# Patient Record
Sex: Female | Born: 1935
Health system: Southern US, Community
[De-identification: ages and names within clinical notes are randomized; demographics above are authoritative.]

## PROBLEM LIST (undated history)

## (undated) DIAGNOSIS — I2510 Atherosclerotic heart disease of native coronary artery without angina pectoris: Secondary | ICD-10-CM

## (undated) DIAGNOSIS — E785 Hyperlipidemia, unspecified: Secondary | ICD-10-CM

## (undated) DIAGNOSIS — I219 Acute myocardial infarction, unspecified: Secondary | ICD-10-CM

## (undated) DIAGNOSIS — I10 Essential (primary) hypertension: Secondary | ICD-10-CM

## (undated) DIAGNOSIS — J45909 Unspecified asthma, uncomplicated: Secondary | ICD-10-CM

## (undated) DIAGNOSIS — R05 Cough: Secondary | ICD-10-CM

## (undated) DIAGNOSIS — K219 Gastro-esophageal reflux disease without esophagitis: Secondary | ICD-10-CM

## (undated) DIAGNOSIS — R053 Chronic cough: Secondary | ICD-10-CM

## (undated) HISTORY — PX: OTHER SURGICAL HISTORY: SHX169

## (undated) HISTORY — DX: Hyperlipidemia, unspecified: E78.5

## (undated) HISTORY — PX: CHOLECYSTECTOMY: SHX55

## (undated) HISTORY — PX: APPENDECTOMY: SHX54

## (undated) HISTORY — PX: EYE SURGERY: SHX253

## (undated) HISTORY — DX: Atherosclerotic heart disease of native coronary artery without angina pectoris: I25.10

---

## 2000-04-11 ENCOUNTER — Encounter (HOSPITAL_BASED_OUTPATIENT_CLINIC_OR_DEPARTMENT_OTHER): Payer: Self-pay | Admitting: Internal Medicine

## 2000-04-11 ENCOUNTER — Ambulatory Visit (HOSPITAL_COMMUNITY): Admission: RE | Admit: 2000-04-11 | Discharge: 2000-04-11 | Payer: Self-pay | Admitting: Internal Medicine

## 2000-11-17 ENCOUNTER — Encounter (HOSPITAL_BASED_OUTPATIENT_CLINIC_OR_DEPARTMENT_OTHER): Payer: Self-pay | Admitting: Internal Medicine

## 2000-11-17 ENCOUNTER — Ambulatory Visit (HOSPITAL_COMMUNITY): Admission: RE | Admit: 2000-11-17 | Discharge: 2000-11-17 | Payer: Self-pay | Admitting: Internal Medicine

## 2000-11-18 ENCOUNTER — Ambulatory Visit (HOSPITAL_COMMUNITY): Admission: RE | Admit: 2000-11-18 | Discharge: 2000-11-18 | Payer: Self-pay | Admitting: Gastroenterology

## 2001-02-16 ENCOUNTER — Ambulatory Visit (HOSPITAL_COMMUNITY): Admission: RE | Admit: 2001-02-16 | Discharge: 2001-02-16 | Payer: Self-pay | Admitting: Internal Medicine

## 2001-02-16 ENCOUNTER — Encounter (HOSPITAL_BASED_OUTPATIENT_CLINIC_OR_DEPARTMENT_OTHER): Payer: Self-pay | Admitting: Internal Medicine

## 2001-03-23 ENCOUNTER — Ambulatory Visit (HOSPITAL_COMMUNITY): Admission: RE | Admit: 2001-03-23 | Discharge: 2001-03-23 | Payer: Self-pay | Admitting: Gastroenterology

## 2001-03-23 ENCOUNTER — Encounter (INDEPENDENT_AMBULATORY_CARE_PROVIDER_SITE_OTHER): Payer: Self-pay | Admitting: Specialist

## 2001-04-06 ENCOUNTER — Encounter (INDEPENDENT_AMBULATORY_CARE_PROVIDER_SITE_OTHER): Payer: Self-pay | Admitting: *Deleted

## 2001-04-06 ENCOUNTER — Ambulatory Visit (HOSPITAL_COMMUNITY): Admission: RE | Admit: 2001-04-06 | Discharge: 2001-04-06 | Payer: Self-pay | Admitting: Gastroenterology

## 2001-04-06 ENCOUNTER — Encounter (INDEPENDENT_AMBULATORY_CARE_PROVIDER_SITE_OTHER): Payer: Self-pay | Admitting: Specialist

## 2001-04-06 ENCOUNTER — Encounter: Payer: Self-pay | Admitting: Gastroenterology

## 2001-07-20 ENCOUNTER — Encounter: Payer: Self-pay | Admitting: Gastroenterology

## 2001-07-20 ENCOUNTER — Ambulatory Visit (HOSPITAL_COMMUNITY): Admission: RE | Admit: 2001-07-20 | Discharge: 2001-07-20 | Payer: Self-pay | Admitting: Gastroenterology

## 2001-08-03 ENCOUNTER — Encounter: Payer: Self-pay | Admitting: Gastroenterology

## 2001-08-03 ENCOUNTER — Ambulatory Visit (HOSPITAL_COMMUNITY): Admission: RE | Admit: 2001-08-03 | Discharge: 2001-08-03 | Payer: Self-pay | Admitting: Gastroenterology

## 2001-09-22 ENCOUNTER — Encounter: Payer: Self-pay | Admitting: Surgery

## 2001-09-25 ENCOUNTER — Encounter (INDEPENDENT_AMBULATORY_CARE_PROVIDER_SITE_OTHER): Payer: Self-pay | Admitting: Specialist

## 2001-09-25 ENCOUNTER — Inpatient Hospital Stay (HOSPITAL_COMMUNITY): Admission: RE | Admit: 2001-09-25 | Discharge: 2001-09-28 | Payer: Self-pay | Admitting: Surgery

## 2001-11-23 ENCOUNTER — Ambulatory Visit (HOSPITAL_COMMUNITY): Admission: RE | Admit: 2001-11-23 | Discharge: 2001-11-23 | Payer: Self-pay | Admitting: Internal Medicine

## 2001-11-23 ENCOUNTER — Encounter (HOSPITAL_BASED_OUTPATIENT_CLINIC_OR_DEPARTMENT_OTHER): Payer: Self-pay | Admitting: Internal Medicine

## 2002-05-25 ENCOUNTER — Ambulatory Visit (HOSPITAL_COMMUNITY): Admission: RE | Admit: 2002-05-25 | Discharge: 2002-05-25 | Payer: Self-pay | Admitting: Internal Medicine

## 2002-05-25 ENCOUNTER — Encounter (HOSPITAL_BASED_OUTPATIENT_CLINIC_OR_DEPARTMENT_OTHER): Payer: Self-pay | Admitting: Internal Medicine

## 2005-11-05 ENCOUNTER — Encounter: Admission: RE | Admit: 2005-11-05 | Discharge: 2005-11-05 | Payer: Self-pay | Admitting: Internal Medicine

## 2006-01-09 ENCOUNTER — Encounter: Admission: RE | Admit: 2006-01-09 | Discharge: 2006-01-09 | Payer: Self-pay | Admitting: Internal Medicine

## 2006-04-01 ENCOUNTER — Encounter: Admission: RE | Admit: 2006-04-01 | Discharge: 2006-04-01 | Payer: Self-pay | Admitting: Internal Medicine

## 2007-11-19 ENCOUNTER — Encounter: Payer: Self-pay | Admitting: Pulmonary Disease

## 2007-12-04 ENCOUNTER — Ambulatory Visit: Payer: Self-pay | Admitting: Pulmonary Disease

## 2007-12-04 DIAGNOSIS — R05 Cough: Secondary | ICD-10-CM

## 2007-12-04 DIAGNOSIS — I10 Essential (primary) hypertension: Secondary | ICD-10-CM

## 2007-12-25 ENCOUNTER — Ambulatory Visit: Payer: Self-pay | Admitting: Pulmonary Disease

## 2007-12-25 DIAGNOSIS — J45909 Unspecified asthma, uncomplicated: Secondary | ICD-10-CM

## 2007-12-28 ENCOUNTER — Telehealth (INDEPENDENT_AMBULATORY_CARE_PROVIDER_SITE_OTHER): Payer: Self-pay | Admitting: *Deleted

## 2008-03-24 ENCOUNTER — Ambulatory Visit: Payer: Self-pay | Admitting: Pulmonary Disease

## 2008-06-22 ENCOUNTER — Telehealth (INDEPENDENT_AMBULATORY_CARE_PROVIDER_SITE_OTHER): Payer: Self-pay | Admitting: *Deleted

## 2008-06-22 ENCOUNTER — Ambulatory Visit: Payer: Self-pay | Admitting: Pulmonary Disease

## 2008-06-22 DIAGNOSIS — J45901 Unspecified asthma with (acute) exacerbation: Secondary | ICD-10-CM

## 2008-09-20 ENCOUNTER — Ambulatory Visit: Payer: Self-pay | Admitting: Pulmonary Disease

## 2008-10-04 ENCOUNTER — Telehealth: Payer: Self-pay | Admitting: Pulmonary Disease

## 2008-10-18 ENCOUNTER — Telehealth: Payer: Self-pay | Admitting: Pulmonary Disease

## 2008-11-14 ENCOUNTER — Telehealth (INDEPENDENT_AMBULATORY_CARE_PROVIDER_SITE_OTHER): Payer: Self-pay | Admitting: *Deleted

## 2010-06-08 NOTE — Procedures (Signed)
[place]  Patient:    Jo Johnson, Jo Johnson Visit Number: [id number] MRN: [id number]          Service Type: END Location: ENDO Attending Physician:  Nelda Marseille Dictated by:   Petra Kuba, M.D. Proc. Date: 03/24/98 Admit Date:  03/23/2001   CC:         Barry Dienes. Eloise Harman, M.D.   Procedure Report  PROCEDURE:  Colonoscopy.  INDICATION:  Patient due for colonic screening with an abnormal CT scan. Consent was signed after risks, benefits, methods, options thoroughly discussed in the office.  MEDICATIONS:  Demerol 60 mg, Versed 6 mg.  DESCRIPTION OF PROCEDURE:  Rectal inspection was pertinent for external hemorrhoids, small.  Digital exam was negative.  The pediatric video adjustable colonoscope was inserted, fairly easily advanced around the colon to the cecum.  This did require rolling her on her back and some abdominal pressure.  The cecum was identified by the appendiceal orifice and the ileocecal valve; in fact, the scope was inserted a short way into the terminal ileum, which was normal.  Photo documentation was obtained, and the scope was slowly withdrawn.  The prep was adequate.  There was some liquid stool that required washing and suctioning.  The cecum, ascending, and transverse were normal as the scope was withdrawn around the descending.  In the mid-descending, a 4 mm polyp was seen and snared, electrocautery applied, and the polyp was suctioned through the scope and collected in the trap.  The scope was further withdrawn.  No additional findings were seen as we slowly withdrew back to the rectum.  Once back in the rectum the scope was then retroflexed, pertinent for some internal hemorrhoids.  The scope was straightened, air was suctioned, and the scope removed.  The patient tolerated the procedure adequately.  There was no obvious immediate complication.  ENDOSCOPIC DIAGNOSES: 1. Internal-external hemorrhoids. 2.  Mid-descending small polyp, snared and recovered. 3. Otherwise within normal limits to the terminal ileum.  PLAN:  Await pathology to determine future colonic screening.  I am awaiting the CT scan to be delivered to my office, and I will check that and review it with the radiologist to discuss needle biopsy versus exploratory laparotomy versus laparoscope.  Probably will need a surgical consultation but will wait on the above and call me sooner p.r.n. Dictated by:   Petra Kuba, M.D. Attending Physician:  Nelda Marseille DD:  03/23/01 TD:  03/23/01 Job: [id number]

## 2010-06-08 NOTE — Op Note (Signed)
   Jo Johnson, Jo Johnson                         ACCOUNT NO.:  [id number]   MEDICAL RECORD NO.:  [id number]                   PATIENT TYPE:  INP   LOCATION:  5713                                 FACILITY:  [facility]   PHYSICIAN:  Thornton Park. Daphine Deutscher, M.D.             DATE OF BIRTH:  [birth year]   DATE OF PROCEDURE:  09/25/2001  DATE OF DISCHARGE:                                 OPERATIVE REPORT   CCS [phone].   PREOPERATIVE DIAGNOSIS:  Mesenteric mass.   POSTOPERATIVE DIAGNOSIS:  Frozen section:  Lipomatous mass with fat  necrosis.   SURGEON:  Thornton Park. Daphine Deutscher, M.D.   ASSISTANT:  Adolph Pollack, M.D.   ANESTHESIA:  General endotracheal.   DESCRIPTION OF PROCEDURE:  The patient is a 77 year old lady with  aforementioned diagnosis.  The patient was taken to room 17 and given  general anesthesia.  The abdomen was prepped with Betadine and draped  sterilely.  A limited midline exploratory laparotomy incision was made, and  the abdomen was entered.  Palpation around failed to reveal anything in the  liver, stomach, and pelvis.  In the mesentery as described by CT were two  masses that were adjacent.  I began near the bowel and incised the overlying  peritoneum and using the Bovie, I dissected these masses from the mesenteric  vessels.  This appeared to be benign.  I was able to then use gentle blunt  dissection and shell these out, preserving the vasculature of the bowel.  Both were excised, and the former was sent for frozen section, which was  benign, showing fat necrosis.  After both had been removed, we used  electrocautery to control any kind of oozing or bleeding.  None was seen.  We inspected this and after the frozen section diagnosis was returned, the  sponge and needle count was correct and the abdomen was then closed with  running #1 PDS from above and below, tying in the middle.  The wound was  irrigated with saline, and the skin was closed with a staple.  The patient  seemed to tolerate the procedure well and was taken to the recovery room in  satisfactory condition.                                               Thornton Park Daphine Deutscher, M.D.    MBM/MEDQ  D:  09/25/2001  T:  09/25/2001  Job:  [id number]   cc:   Barry Dienes. Eloise Harman, M.D.

## 2010-06-08 NOTE — Discharge Summary (Signed)
   NAMESHARMILA, WROBLESKI                         ACCOUNT NO.:  [id number]   MEDICAL RECORD NO.:  [id number]                   PATIENT TYPE:  INP   LOCATION:  5713                                 FACILITY:  [facility]   PHYSICIAN:  Thornton Park. Daphine Deutscher, M.D.             DATE OF BIRTH:  [birth year]   DATE OF ADMISSION:  09/25/2001  DATE OF DISCHARGE:  09/28/2001                                 DISCHARGE SUMMARY   PROCEDURE:  Exploratory laparotomy and biopsy of mesenteric mass on  September 25, 2001.   COURSE IN THE HOSPITAL:  The patient came in with a mass in her abdomen.  She underwent exploratory laparotomy and a biopsy of this mass which proved  to be an inflamed cyst with benign lymphoid aggregates and some fat  necrosis.  No evidence of malignancy.  The patient got along very well and  was discharged on September 28, 2001.   CONDITION ON DISCHARGE:  Condition improved.   FOLLOWUP:  The patient was instructed to return to the office for staple  removal within the week.                                               Thornton Park Daphine Deutscher, M.D.    MBM/MEDQ  D:  10/19/2001  T:  10/21/2001  Job:  [id number]

## 2011-12-09 DIAGNOSIS — I10 Essential (primary) hypertension: Secondary | ICD-10-CM | POA: Diagnosis not present

## 2011-12-09 DIAGNOSIS — E785 Hyperlipidemia, unspecified: Secondary | ICD-10-CM | POA: Diagnosis not present

## 2011-12-16 DIAGNOSIS — R05 Cough: Secondary | ICD-10-CM | POA: Diagnosis not present

## 2011-12-16 DIAGNOSIS — E785 Hyperlipidemia, unspecified: Secondary | ICD-10-CM | POA: Diagnosis not present

## 2011-12-16 DIAGNOSIS — Z0000 Encounter for general adult medical examination without abnormal findings: Secondary | ICD-10-CM | POA: Diagnosis not present

## 2011-12-16 DIAGNOSIS — I10 Essential (primary) hypertension: Secondary | ICD-10-CM | POA: Diagnosis not present

## 2011-12-16 DIAGNOSIS — Z1331 Encounter for screening for depression: Secondary | ICD-10-CM | POA: Diagnosis not present

## 2011-12-16 DIAGNOSIS — R82998 Other abnormal findings in urine: Secondary | ICD-10-CM | POA: Diagnosis not present

## 2011-12-23 DIAGNOSIS — Z1212 Encounter for screening for malignant neoplasm of rectum: Secondary | ICD-10-CM | POA: Diagnosis not present

## 2012-06-21 ENCOUNTER — Emergency Department (HOSPITAL_COMMUNITY): Payer: Medicare Other

## 2012-06-21 ENCOUNTER — Inpatient Hospital Stay (HOSPITAL_COMMUNITY)
Admission: EM | Admit: 2012-06-21 | Discharge: 2012-06-23 | DRG: 247 | Disposition: A | Discharge status: Home / Self Care | Payer: Medicare Other | Attending: Cardiovascular Disease | Admitting: Cardiovascular Disease

## 2012-06-21 ENCOUNTER — Encounter (HOSPITAL_COMMUNITY): Payer: Self-pay | Admitting: Family Medicine

## 2012-06-21 DIAGNOSIS — I10 Essential (primary) hypertension: Secondary | ICD-10-CM | POA: Diagnosis present

## 2012-06-21 DIAGNOSIS — F17200 Nicotine dependence, unspecified, uncomplicated: Secondary | ICD-10-CM | POA: Diagnosis not present

## 2012-06-21 DIAGNOSIS — R079 Chest pain, unspecified: Secondary | ICD-10-CM | POA: Diagnosis not present

## 2012-06-21 DIAGNOSIS — I2510 Atherosclerotic heart disease of native coronary artery without angina pectoris: Secondary | ICD-10-CM | POA: Diagnosis not present

## 2012-06-21 DIAGNOSIS — R0789 Other chest pain: Secondary | ICD-10-CM | POA: Diagnosis not present

## 2012-06-21 DIAGNOSIS — R05 Cough: Secondary | ICD-10-CM | POA: Diagnosis present

## 2012-06-21 DIAGNOSIS — I214 Non-ST elevation (NSTEMI) myocardial infarction: Principal | ICD-10-CM | POA: Diagnosis present

## 2012-06-21 DIAGNOSIS — I200 Unstable angina: Secondary | ICD-10-CM | POA: Diagnosis present

## 2012-06-21 DIAGNOSIS — K219 Gastro-esophageal reflux disease without esophagitis: Secondary | ICD-10-CM | POA: Diagnosis present

## 2012-06-21 HISTORY — DX: Unspecified asthma, uncomplicated: J45.909

## 2012-06-21 HISTORY — DX: Gastro-esophageal reflux disease without esophagitis: K21.9

## 2012-06-21 HISTORY — DX: Essential (primary) hypertension: I10

## 2012-06-21 HISTORY — DX: Chronic cough: R05.3

## 2012-06-21 HISTORY — DX: Cough: R05

## 2012-06-21 LAB — BASIC METABOLIC PANEL
CO2: 27 mEq/L (ref 19–32)
Chloride: 103 mEq/L (ref 96–112)
Creatinine, Ser: 0.83 mg/dL (ref 0.50–1.10)
GFR calc Af Amer: 72 mL/min — ABNORMAL LOW (ref >90)
Potassium: 3.6 mEq/L (ref 3.5–5.1)
Sodium: 139 mEq/L (ref 135–145)

## 2012-06-21 LAB — LIPASE, BLOOD: Lipase: 51 U/L (ref 11–59)

## 2012-06-21 LAB — TROPONIN I
Troponin I: <0.3 ng/mL (ref <0.30)
Troponin I: 0.38 ng/mL (ref <0.30)

## 2012-06-21 LAB — CBC
MCV: 88.4 fL (ref 78.0–100.0)
Platelets: 194 10*3/uL (ref 150–400)
RBC: 4.3 MIL/uL (ref 3.87–5.11)
RDW: 12.6 % (ref 11.5–15.5)
WBC: 4.7 10*3/uL (ref 4.0–10.5)

## 2012-06-21 LAB — PRO B NATRIURETIC PEPTIDE: Pro B Natriuretic peptide (BNP): 97.2 pg/mL (ref 0–450)

## 2012-06-21 MED ORDER — HEPARIN BOLUS VIA INFUSION: 3000.0000 [IU] | Freq: Once | INTRAVENOUS | Status: DC

## 2012-06-21 MED ORDER — ASPIRIN 81 MG PO CHEW
324.0000 mg | CHEWABLE_TABLET | Freq: Once | ORAL | Status: AC
  Administered 2012-06-21: 324 mg via ORAL
  Filled 2012-06-21: qty 4

## 2012-06-21 MED ORDER — ATORVASTATIN CALCIUM 10 MG PO TABS
10.0000 mg | ORAL_TABLET | Freq: Every day | ORAL | Status: DC
  Administered 2012-06-21 – 2012-06-23 (×3): 10 mg via ORAL
  Filled 2012-06-21 (×4): qty 1

## 2012-06-21 MED ORDER — NITROGLYCERIN 0.4 MG SL SUBL: 0.4000 mg | SUBLINGUAL_TABLET | SUBLINGUAL | Status: DC | PRN

## 2012-06-21 MED ORDER — LEVALBUTEROL HCL 0.63 MG/3ML IN NEBU: 0.6300 mg | INHALATION_SOLUTION | Freq: Three times a day (TID) | RESPIRATORY_TRACT | Status: DC | PRN

## 2012-06-21 MED ORDER — PANTOPRAZOLE SODIUM 40 MG PO TBEC
40.0000 mg | DELAYED_RELEASE_TABLET | Freq: Every day | ORAL | Status: DC
  Administered 2012-06-22 – 2012-06-23 (×2): 40 mg via ORAL
  Filled 2012-06-21 (×2): qty 1

## 2012-06-21 MED ORDER — AMLODIPINE BESYLATE 5 MG PO TABS
5.0000 mg | ORAL_TABLET | Freq: Every day | ORAL | Status: DC
  Administered 2012-06-22 – 2012-06-23 (×2): 5 mg via ORAL
  Filled 2012-06-21 (×2): qty 1

## 2012-06-21 MED ORDER — HEPARIN (PORCINE) IN NACL 100-0.45 UNIT/ML-% IJ SOLN
800.0000 [IU]/h | INTRAMUSCULAR | Status: DC
  Filled 2012-06-21 (×2): qty 250

## 2012-06-21 MED ORDER — NITROGLYCERIN 0.4 MG SL SUBL
0.4000 mg | SUBLINGUAL_TABLET | SUBLINGUAL | Status: DC | PRN
  Filled 2012-06-21: qty 25

## 2012-06-21 MED ORDER — IRBESARTAN 300 MG PO TABS
300.0000 mg | ORAL_TABLET | Freq: Every day | ORAL | Status: DC
  Administered 2012-06-22 – 2012-06-23 (×2): 300 mg via ORAL
  Filled 2012-06-21 (×2): qty 1

## 2012-06-21 MED ORDER — ASPIRIN EC 81 MG PO TBEC
81.0000 mg | DELAYED_RELEASE_TABLET | Freq: Every day | ORAL | Status: DC
  Administered 2012-06-23: 81 mg via ORAL
  Filled 2012-06-21 (×2): qty 1

## 2012-06-21 MED ORDER — ONDANSETRON HCL 4 MG/2ML IJ SOLN
4.0000 mg | Freq: Four times a day (QID) | INTRAMUSCULAR | Status: DC | PRN
  Administered 2012-06-22: 4 mg via INTRAVENOUS
  Filled 2012-06-21: qty 2

## 2012-06-21 MED ORDER — ACETAMINOPHEN 325 MG PO TABS: 650.0000 mg | ORAL_TABLET | ORAL | Status: DC | PRN

## 2012-06-21 MED ORDER — METOPROLOL TARTRATE 12.5 MG HALF TABLET
12.5000 mg | ORAL_TABLET | Freq: Two times a day (BID) | ORAL | Status: DC
  Administered 2012-06-21 – 2012-06-23 (×4): 12.5 mg via ORAL
  Filled 2012-06-21 (×6): qty 1

## 2012-06-21 NOTE — Progress Notes (Signed)
ANTICOAGULATION CONSULT NOTE Pharmacy Consult for UFH Indication: USAP  No Known Allergies  Patient Measurements: Height: 5' 2.99" (160 cm) Weight: 134 lb 0.6 oz (60.8 kg) IBW/kg (Calculated) : 52.38 Heparin Dosing Weight: 60kg  Vital Signs: Temp: 98.6 F (37 C) (06/01 2141) Temp src: Oral (06/01 2141) BP: 140/67 mmHg (06/01 2141) Pulse Rate: 76 (06/01 2141)  Labs:  Recent Labs  06/21/12 1115 06/21/12 1748 06/21/12 2210  HGB 13.2  --   --   HCT 38.0  --   --   PLT 194  --   --   CREATININE 0.83  --   --   TROPONINI <0.30 0.38* 0.47*    Estimated Creatinine Clearance: 40.2 ml/min (by C-G formula based on Cr of 0.83).  Assessment: 79 y/o female with chest pain for heparin  Goal of Therapy:  Heparin level 0.3-0.7 units/ml Monitor platelets by anticoagulation protocol: Yes   Plan:  Continue Heparin at current rate Follow-up am labs.  Geannie Risen, PharmD, BCPS  06/21/2012,11:56 PM

## 2012-06-21 NOTE — Progress Notes (Signed)
Pt c/o chronic cough, requesting recommendations for pulmonary MD.

## 2012-06-21 NOTE — ED Provider Notes (Signed)
History     CSN: [id number]  Arrival date & time 06/21/12  1053   First MD Initiated Contact with Patient 06/21/12 1138      Chief Complaint  Patient presents with  . Chest Pain    (Consider location/radiation/quality/duration/timing/severity/associated sxs/prior treatment) HPI Comments: Patient presents to the ED with a CC of chest pain.  Patient states that she first noticed CP this morning while she was sitting on the couch.  She describes the pain as a dull pressure that radiates to her neck and arms.  She has no known history of ACS.  She has not tried anything to make the pain better.  She denies fever, chills, nausea, vomiting, diarrhea, numbness or tingling of the extremities.  The history is provided by the patient. No language interpreter was used.    Past Medical History  Diagnosis Date  . Hypertension   . Acid reflux     History reviewed. No pertinent past surgical history.  History reviewed. No pertinent family history.  History  Substance Use Topics  . Smoking status: Never Smoker   . Smokeless tobacco: Current User    Types: Chew  . Alcohol Use: No    OB History   Grav Para Term Preterm Abortions TAB SAB Ect Mult Living                  Review of Systems  All other systems reviewed and are negative.    Allergies  Review of patient's allergies indicates no known allergies.  Home Medications   Current Outpatient Rx  Name  Route  Sig  Dispense  Refill  . amLODipine (NORVASC) 5 MG tablet   Oral   Take 5 mg by mouth daily.         Marland Kitchen omeprazole (PRILOSEC) 20 MG capsule   Oral   Take 20 mg by mouth 2 (two) times daily.         . valsartan (DIOVAN) 320 MG tablet   Oral   Take 320 mg by mouth daily.           BP 168/84  Pulse 75  Temp(Src) 98.1 F (36.7 C)  Resp 16  SpO2 99%  Physical Exam  Nursing note and vitals reviewed. Constitutional: She is oriented to person, place, and time. She appears well-developed and  well-nourished.  HENT:  Head: Normocephalic and atraumatic.  Eyes: Conjunctivae and EOM are normal. Pupils are equal, round, and reactive to light.  Neck: Normal range of motion. Neck supple.  Cardiovascular: Normal rate and regular rhythm.  Exam reveals no gallop and no friction rub.   No murmur heard. Pulmonary/Chest: Effort normal and breath sounds normal. No respiratory distress. She has no wheezes. She has no rales. She exhibits no tenderness.  Abdominal: Soft. Bowel sounds are normal. She exhibits no distension and no mass. There is no tenderness. There is no rebound and no guarding.  Musculoskeletal: Normal range of motion. She exhibits no edema and no tenderness.  Neurological: She is alert and oriented to person, place, and time.  Skin: Skin is warm and dry.  Psychiatric: She has a normal mood and affect. Her behavior is normal. Judgment and thought content normal.    ED Course  Procedures (including critical care time)  Labs Reviewed  BASIC METABOLIC PANEL - Abnormal; Notable for the following:    Glucose, Bld 108 (*)    GFR calc non Af Amer 62 (*)    GFR calc Af Amer 72 (*)  All other components within normal limits  CBC  PRO B NATRIURETIC PEPTIDE  TROPONIN I   Dg Chest 2 View  06/21/2012   *RADIOLOGY REPORT*  Clinical Data: Chest pain.  CHEST - 2 VIEW  Comparison: 12/04/2007  Findings: Moderate right hemidiaphragm elevation is unchanged. Right glenohumeral joint osteoarthritis with intra-articular loose bodies identified. Normal heart size and mediastinal contours for age.  No pleural effusion or pneumothorax.  Biapical pleural thickening is unchanged.  Apical lordotic patient positioning. Clear lungs.  IMPRESSION: No acute cardiopulmonary disease.  Similar moderate right hemidiaphragm elevation.   Original Report Authenticated By: Jeronimo Greaves, M.D.   ED ECG REPORT  I personally interpreted this EKG   Date: 06/21/2012   Rate: 85  Rhythm: normal sinus rhythm  QRS Axis:  normal  Intervals: normal  ST/T Wave abnormalities: normal  Conduction Disutrbances:none  Narrative Interpretation:   Old EKG Reviewed: none available    1. Chest pain       MDM  Patient with chest pain that started this morning. It is associated with shortness of breath. It came while she was sitting on a couch doing nothing. I suspect that it is likely unstable angina. Troponin and chest x-ray are negative. EKG shows PVC, but is otherwise normal. Will discuss with Dr. Patria Mane, anticipate admission for ACS rule out.  Discussed the patient with [facility], who recommend calling [facility].  [facility] will admit the patient.  Heparin per pharmacy.        Roxy Horseman, PA-C 06/21/12 1439

## 2012-06-21 NOTE — H&P (Signed)
Jo Johnson is an 79 y.o. female.   Chief Complaint: Chest pain HPI:   The patient is an 79 year old Caucasian female with a history of hypertension and acid reflux. She states she developed chest pain this morning after breakfast when she was laying on the couch, which she describes as a "hurting", along with pain in her back both arms, neck and dizziness. She also reports some abdominal pain and lower extremity edema. She denies nausea, vomiting, fever, shortness of breath beyond her usual state, orthopnea, PND, dysuria, hematochezia, melena.  Vacations: Prior to Admission medications   Medication Sig Start Date End Date Taking? Authorizing Provider  amLODipine (NORVASC) 5 MG tablet Take 5 mg by mouth daily.   Yes Historical Provider, MD  omeprazole (PRILOSEC) 20 MG capsule Take 20 mg by mouth 2 (two) times daily.   Yes Historical Provider, MD  valsartan (DIOVAN) 320 MG tablet Take 320 mg by mouth daily.   Yes Historical Provider, MD     Past Medical History  Diagnosis Date  . Hypertension   . Acid reflux     History reviewed. No pertinent past surgical history.  History reviewed. No pertinent family history. Social History:  reports that she has never smoked. Her smokeless tobacco use includes Chew. She reports that she does not drink alcohol. Her drug history is not on file.  Allergies: No Known Allergies   (Not in a hospital admission)  Results for orders placed during the hospital encounter of 06/21/12 (from the past 48 hour(s))  CBC     Status: None   Collection Time    06/21/12 11:15 AM      Result Value Range   WBC 4.7  4.0 - 10.5 K/uL   RBC 4.30  3.87 - 5.11 MIL/uL   Hemoglobin 13.2  12.0 - 15.0 g/dL   HCT 16.1  09.6 - 04.5 %   MCV 88.4  78.0 - 100.0 fL   MCH 30.7  26.0 - 34.0 pg   MCHC 34.7  30.0 - 36.0 g/dL   RDW 40.9  81.1 - 91.4 %   Platelets 194  150 - 400 K/uL  BASIC METABOLIC PANEL     Status: Abnormal   Collection Time    06/21/12 11:15 AM   Result Value Range   Sodium 139  135 - 145 mEq/L   Potassium 3.6  3.5 - 5.1 mEq/L   Chloride 103  96 - 112 mEq/L   CO2 27  19 - 32 mEq/L   Glucose, Bld 108 (*) 70 - 99 mg/dL   BUN 15  6 - 23 mg/dL   Creatinine, Ser 7.82  0.50 - 1.10 mg/dL   Calcium 9.2  8.4 - 95.6 mg/dL   GFR calc non Af Amer 62 (*) >90 mL/min   GFR calc Af Amer 72 (*) >90 mL/min   Comment:            The eGFR has been calculated     using the CKD EPI equation.     This calculation has not been     validated in all clinical     situations.     eGFR's persistently     <90 mL/min signify     possible Chronic Kidney Disease.  PRO B NATRIURETIC PEPTIDE     Status: None   Collection Time    06/21/12 11:15 AM      Result Value Range   Pro B Natriuretic peptide (BNP) 97.2  0 - 450  pg/mL  TROPONIN I     Status: None   Collection Time    06/21/12 11:15 AM      Result Value Range   Troponin I <0.30  <0.30 ng/mL   Comment:            Due to the release kinetics of cTnI,     a negative result within the first hours     of the onset of symptoms does not rule out     myocardial infarction with certainty.     If myocardial infarction is still suspected,     repeat the test at appropriate intervals.   Dg Chest 2 View  06/21/2012   *RADIOLOGY REPORT*  Clinical Data: Chest pain.  CHEST - 2 VIEW  Comparison: 12/04/2007  Findings: Moderate right hemidiaphragm elevation is unchanged. Right glenohumeral joint osteoarthritis with intra-articular loose bodies identified. Normal heart size and mediastinal contours for age.  No pleural effusion or pneumothorax.  Biapical pleural thickening is unchanged.  Apical lordotic patient positioning. Clear lungs.  IMPRESSION: No acute cardiopulmonary disease.  Similar moderate right hemidiaphragm elevation.   Original Report Authenticated By: Jeronimo Greaves, M.D.    Review of Systems  Constitutional: Negative for fever and diaphoresis.  HENT: Positive for neck pain (Neck pain associated with  chest pain). Negative for sore throat.   Respiratory: Positive for cough and shortness of breath (no more than usual).   Cardiovascular: Positive for chest pain. Negative for orthopnea, leg swelling and PND.  Gastrointestinal: Positive for abdominal pain. Negative for nausea, vomiting, diarrhea, constipation, blood in stool and melena.  Genitourinary: Negative for hematuria.  Musculoskeletal: Positive for myalgias (Arm pain associated with chest pain).  Neurological: Positive for dizziness.    Blood pressure 154/109, pulse 71, temperature 98.1 F (36.7 C), resp. rate 16, height 5' 2.99" (1.6 m), weight 136 lb 11 oz (62 kg), SpO2 95.00%. Physical Exam  Constitutional: She is oriented to person, place, and time. She appears well-developed and well-nourished.  HENT:  Head: Normocephalic and atraumatic.  Mouth/Throat: Oropharynx is clear and moist. No oropharyngeal exudate.  Eyes: EOM are normal. Pupils are equal, round, and reactive to light. No scleral icterus.  Neck: Normal range of motion. Neck supple. No JVD present.  Cardiovascular: Normal rate, regular rhythm, S1 normal and S2 normal.   No murmur heard. Pulses:      Radial pulses are 2+ on the right side, and 2+ on the left side.       Dorsalis pedis pulses are 2+ on the right side, and 2+ on the left side.  No carotid bruit  Respiratory: Effort normal and breath sounds normal. She has no rales. She exhibits no tenderness.  GI: Soft. Bowel sounds are normal. There is tenderness (epigastric).  Musculoskeletal: She exhibits no edema.  Lymphadenopathy:    She has no cervical adenopathy.  Neurological: She is alert and oriented to person, place, and time. She exhibits normal muscle tone.  Skin: Skin is warm and dry.  Psychiatric: She has a normal mood and affect.     Assessment/Plan Principal Problem:   Unstable angina Active Problems:   HYPERTENSION   Cough  Plan: Patient will be admitted be admitted to the telemetry floor  for observation as she is chest pain-free at this time. EKG shows no acute changes.  She does complain of some epigastric pain as well. Add protonix.  We'll check lipids, TSH, A1c, cycle cardiac enzymes, lipase.  Continue aspirin and start Lopressor 12.5 mg  bid and statin along with home meds which include amlodipine and Diovan. Schedule Lexiscan myoview for the AM.  Xopenex when necessary.  Wilburt Finlay 06/21/2012, 3:45 PM    Agree with note written by Jones Skene PAC  79 y/o female w/o prior cardiac history with only CRF signif for Rx HTN developed worrisome CP this AM. ALso has h/o GERD. Currently pain free. Exam benign. EKG w/o acute changes. Initial trop neg, second minimally +. Plans are for lexiscan myoview unless trop continues to rise then plan cath. Start iv hep.     Runell Gess 06/21/2012 8:11 PM

## 2012-06-21 NOTE — ED Notes (Signed)
Patient transported to X-ray 

## 2012-06-21 NOTE — ED Provider Notes (Signed)
Medical screening examination/treatment/procedure(s) were conducted as a shared visit with non-physician practitioner(s) and myself.  I personally evaluated the patient during the encounter  3:54 PM Patient is chest pain-free at this time.  Aspirin has been given.  Heparin has been ordered.  Cardiology to admit the patient.  EKG and troponin are both without abnormalities.  [facility] to evaluate  Lyanne Co, MD 06/21/12 1555

## 2012-06-21 NOTE — Progress Notes (Signed)
ANTICOAGULATION CONSULT NOTE - Initial Consult  Pharmacy Consult for UFH Indication: USAP  No Known Allergies  Patient Measurements: Height: 5' 2.99" (160 cm) Weight: 136 lb 11 oz (62 kg) IBW/kg (Calculated) : 52.38 Heparin Dosing Weight: 60kg  Vital Signs: Temp: 98.1 F (36.7 C) (06/01 1100) BP: 154/109 mmHg (06/01 1336) Pulse Rate: 71 (06/01 1336)  Labs:  Recent Labs  06/21/12 1115  HGB 13.2  HCT 38.0  PLT 194  CREATININE 0.83  TROPONINI <0.30    Estimated Creatinine Clearance: 40.2 ml/min (by C-G formula based on Cr of 0.83).   Medical History: Past Medical History  Diagnosis Date  . Hypertension   . Acid reflux     Medications:   (Not in a hospital admission)  Assessment: 79 y/o female patient admitted with chest pain requiring anticoagulation for r/o MI. EKG wnl, first troponin negative.  Goal of Therapy:  Heparin level 0.3-0.7 units/ml Monitor platelets by anticoagulation protocol: Yes   Plan:  Heparin 3000 unit IV bolus followed by infusion at 800 units/hr. Check 8 hour heparin level with daily cbc and heparin level.  Verlene Mayer, PharmD, BCPS Pager [phone] 06/21/2012,2:26 PM

## 2012-06-21 NOTE — Progress Notes (Signed)
CRITICAL VALUE ALERT  Critical value received: troponin 0.38  Date of notification:  06/21/12  Time of notification:  1845  Critical value read back:yes  Nurse who received alert: Newell Coral RN  MD notified (1st page): Turner  Time of first page:  1845  MD notified (2nd page):  Time of second page:  Responding MD:  Mayford Knife  Time MD responded:  1848  Pt denies CP, heparin gtt started, pt NPO past midnight for possible cath.

## 2012-06-21 NOTE — ED Notes (Signed)
Per pt sts chest pain that started this am associated with SOB. sts some dizziness without N,V.

## 2012-06-21 NOTE — ED Notes (Signed)
Ordered pt a diet tray 

## 2012-06-22 ENCOUNTER — Encounter (HOSPITAL_COMMUNITY)
Admission: EM | Disposition: A | Discharge status: Home / Self Care | Payer: Self-pay | Source: Home / Self Care | Attending: Cardiovascular Disease

## 2012-06-22 DIAGNOSIS — I214 Non-ST elevation (NSTEMI) myocardial infarction: Secondary | ICD-10-CM | POA: Diagnosis not present

## 2012-06-22 DIAGNOSIS — K219 Gastro-esophageal reflux disease without esophagitis: Secondary | ICD-10-CM | POA: Diagnosis not present

## 2012-06-22 DIAGNOSIS — I2510 Atherosclerotic heart disease of native coronary artery without angina pectoris: Secondary | ICD-10-CM | POA: Diagnosis not present

## 2012-06-22 DIAGNOSIS — I10 Essential (primary) hypertension: Secondary | ICD-10-CM | POA: Diagnosis not present

## 2012-06-22 DIAGNOSIS — F17200 Nicotine dependence, unspecified, uncomplicated: Secondary | ICD-10-CM | POA: Diagnosis not present

## 2012-06-22 DIAGNOSIS — R079 Chest pain, unspecified: Secondary | ICD-10-CM | POA: Diagnosis not present

## 2012-06-22 HISTORY — PX: PERCUTANEOUS CORONARY STENT INTERVENTION (PCI-S): SHX5485

## 2012-06-22 HISTORY — PX: LEFT HEART CATHETERIZATION WITH CORONARY ANGIOGRAM: SHX5451

## 2012-06-22 LAB — LIPID PANEL
Cholesterol: 153 mg/dL (ref 0–200)
HDL: 45 mg/dL (ref >39)
Total CHOL/HDL Ratio: 3.4 RATIO
VLDL: 20 mg/dL (ref 0–40)

## 2012-06-22 LAB — BASIC METABOLIC PANEL
BUN: 17 mg/dL (ref 6–23)
CO2: 28 mEq/L (ref 19–32)
Chloride: 105 mEq/L (ref 96–112)
Creatinine, Ser: 0.9 mg/dL (ref 0.50–1.10)
GFR calc Af Amer: 65 mL/min — ABNORMAL LOW (ref >90)
Potassium: 3.7 mEq/L (ref 3.5–5.1)

## 2012-06-22 LAB — HEPARIN LEVEL (UNFRACTIONATED)
Heparin Unfractionated: 0.5 IU/mL (ref 0.30–0.70)
Heparin Unfractionated: 0.38 IU/mL (ref 0.30–0.70)

## 2012-06-22 LAB — CBC
HCT: 34.4 % — ABNORMAL LOW (ref 36.0–46.0)
MCV: 88.9 fL (ref 78.0–100.0)
RBC: 3.87 MIL/uL (ref 3.87–5.11)
RDW: 12.7 % (ref 11.5–15.5)
WBC: 4.9 10*3/uL (ref 4.0–10.5)

## 2012-06-22 LAB — POCT ACTIVATED CLOTTING TIME: Activated Clotting Time: 633 seconds

## 2012-06-22 SURGERY — LEFT HEART CATHETERIZATION WITH CORONARY ANGIOGRAM
Anesthesia: LOCAL

## 2012-06-22 MED ORDER — ASPIRIN 81 MG PO CHEW: 324.0000 mg | CHEWABLE_TABLET | ORAL | Status: DC

## 2012-06-22 MED ORDER — FAMOTIDINE IN NACL 20-0.9 MG/50ML-% IV SOLN
INTRAVENOUS | Status: AC
  Filled 2012-06-22: qty 50

## 2012-06-22 MED ORDER — SODIUM CHLORIDE 0.9 % IJ SOLN: 3.0000 mL | INTRAMUSCULAR | Status: DC | PRN

## 2012-06-22 MED ORDER — SODIUM CHLORIDE 0.9 % IJ SOLN
3.0000 mL | Freq: Two times a day (BID) | INTRAMUSCULAR | Status: DC
  Administered 2012-06-22 – 2012-06-23 (×2): 3 mL via INTRAVENOUS

## 2012-06-22 MED ORDER — HEPARIN (PORCINE) IN NACL 2-0.9 UNIT/ML-% IJ SOLN
INTRAMUSCULAR | Status: AC
  Filled 2012-06-22: qty 1000

## 2012-06-22 MED ORDER — LIDOCAINE HCL (PF) 1 % IJ SOLN
INTRAMUSCULAR | Status: AC
  Filled 2012-06-22: qty 30

## 2012-06-22 MED ORDER — SODIUM CHLORIDE 0.9 % IV SOLN: 250.0000 mL | INTRAVENOUS | Status: DC | PRN

## 2012-06-22 MED ORDER — SODIUM CHLORIDE 0.9 % IJ SOLN: 3.0000 mL | Freq: Two times a day (BID) | INTRAMUSCULAR | Status: DC

## 2012-06-22 MED ORDER — MORPHINE SULFATE 2 MG/ML IJ SOLN: 1.0000 mg | INTRAMUSCULAR | Status: DC | PRN

## 2012-06-22 MED ORDER — SODIUM CHLORIDE 0.9 % IV SOLN: 1.0000 mL/kg/h | INTRAVENOUS | Status: DC

## 2012-06-22 MED ORDER — HEPARIN SODIUM (PORCINE) 1000 UNIT/ML IJ SOLN
INTRAMUSCULAR | Status: AC
  Filled 2012-06-22: qty 1

## 2012-06-22 MED ORDER — BIVALIRUDIN 250 MG IV SOLR
INTRAVENOUS | Status: AC
  Filled 2012-06-22: qty 250

## 2012-06-22 MED ORDER — MIDAZOLAM HCL 2 MG/2ML IJ SOLN
INTRAMUSCULAR | Status: AC
  Filled 2012-06-22: qty 2

## 2012-06-22 MED ORDER — FENTANYL CITRATE 0.05 MG/ML IJ SOLN
INTRAMUSCULAR | Status: AC
  Filled 2012-06-22: qty 2

## 2012-06-22 MED ORDER — TICAGRELOR 90 MG PO TABS
90.0000 mg | ORAL_TABLET | Freq: Two times a day (BID) | ORAL | Status: DC
  Administered 2012-06-23: 90 mg via ORAL
  Filled 2012-06-22 (×3): qty 1

## 2012-06-22 MED ORDER — ASPIRIN 81 MG PO CHEW
324.0000 mg | CHEWABLE_TABLET | ORAL | Status: AC
  Administered 2012-06-22: 324 mg via ORAL
  Filled 2012-06-22: qty 4

## 2012-06-22 MED ORDER — VERAPAMIL HCL 2.5 MG/ML IV SOLN
INTRAVENOUS | Status: AC
  Filled 2012-06-22: qty 2

## 2012-06-22 MED ORDER — TICAGRELOR 90 MG PO TABS
ORAL_TABLET | ORAL | Status: AC
  Administered 2012-06-22: 90 mg via ORAL
  Filled 2012-06-22: qty 2

## 2012-06-22 MED ORDER — SODIUM CHLORIDE 0.9 % IV SOLN: 1.0000 mL/kg/h | INTRAVENOUS | Status: AC

## 2012-06-22 NOTE — Progress Notes (Signed)
trasnferred infrom the cath lab awake and alert. TRBand to right wrist intact with 12 ml air, elevated with pillow pulses palpable.. Pulse ox to right thumb. angiomax in progress and completed at 1730h. Continue to monitor.

## 2012-06-22 NOTE — Progress Notes (Signed)
ANTICOAGULATION CONSULT NOTE Pharmacy Consult for UFH Indication: USAP  No Known Allergies  Patient Measurements: Height: 5' 2.99" (160 cm) Weight: 132 lb 11.5 oz (60.2 kg) IBW/kg (Calculated) : 52.38 Heparin Dosing Weight: 60kg  Vital Signs: Temp: 98.5 F (36.9 C) (06/02 0544) Temp src: Oral (06/02 0544) BP: 153/52 mmHg (06/02 1000) Pulse Rate: 72 (06/02 1001)  Labs:  Recent Labs  06/21/12 1115 06/21/12 1748 06/21/12 2210 06/21/12 2313 06/22/12 0520  HGB 13.2  --   --   --  11.5*  HCT 38.0  --   --   --  34.4*  PLT 194  --   --   --  166  HEPARINUNFRC  --   --   --  0.50 0.38  CREATININE 0.83  --   --   --  0.90  TROPONINI <0.30 0.38* 0.47*  --  <0.30   Estimated Creatinine Clearance: 37.1 ml/min (by C-G formula based on Cr of 0.9).  Assessment: 79 y/o female admitted with c/o chest pain.  We have been asked to dose her IV heparin.  Her CBC is stable and she is without noted bleeding complications.  Her heparin level is therapeutic at 0.38.  Goal of Therapy:  Heparin level 0.3-0.7 units/ml Monitor platelets by anticoagulation protocol: Yes   Plan:  Continue Heparin at current rate Follow-up am labs.  Nadara Mustard, PharmD., MS Clinical Pharmacist Pager:  [phone] Thank you for allowing pharmacy to be part of this patients care team. 06/22/2012,10:09 AM

## 2012-06-22 NOTE — Progress Notes (Signed)
[facility]  Subjective: Denies further CP. No SOB.  Objective: Vital signs in last 24 hours: Temp:  [98.1 F (36.7 C)-98.8 F (37.1 C)] 98.5 F (36.9 C) (06/02 0544) Pulse Rate:  [64-82] 64 (06/02 0544) Resp:  [16-26] 18 (06/02 0544) BP: (135-175)/(62-109) 135/62 mmHg (06/02 0544) SpO2:  [95 %-100 %] 97 % (06/02 0544) Weight:  [132 lb 11.5 oz (60.2 kg)-136 lb 11 oz (62 kg)] 132 lb 11.5 oz (60.2 kg) (06/02 0544) Last BM Date: 06/21/12  Intake/Output from previous day:   Intake/Output this shift:    Medications Current Facility-Administered Medications  Medication Dose Route Frequency Provider Last Rate Last Dose  . 0.9 %  sodium chloride infusion  250 mL Intravenous PRN Wilburt Finlay, PA-C      . 0.9 %  sodium chloride infusion  1 mL/kg/hr Intravenous Continuous Wilburt Finlay, PA-C      . acetaminophen (TYLENOL) tablet 650 mg  650 mg Oral Q4H PRN Wilburt Finlay, PA-C      . amLODipine (NORVASC) tablet 5 mg  5 mg Oral Daily Wilburt Finlay, PA-C      . [START ON 06/23/2012] aspirin chewable tablet 324 mg  324 mg Oral Pre-Cath Wilburt Finlay, PA-C      . aspirin EC tablet 81 mg  81 mg Oral Daily Wilburt Finlay, PA-C      . atorvastatin (LIPITOR) tablet 10 mg  10 mg Oral q1800 Wilburt Finlay, PA-C   10 mg at 06/21/12 1844  . heparin ADULT infusion 100 units/mL (25000 units/250 mL)  800 Units/hr Intravenous Continuous Lyanne Co, MD      . heparin bolus via infusion 3,000 Units  3,000 Units Intravenous Once Lyanne Co, MD      . irbesartan Evlyn Kanner) tablet 300 mg  300 mg Oral Daily Wilburt Finlay, PA-C      . levalbuterol [facility]) nebulizer solution 0.63 mg  0.63 mg Nebulization Q8H PRN Wilburt Finlay, PA-C      . metoprolol tartrate (LOPRESSOR) tablet 12.5 mg  12.5 mg Oral BID Wilburt Finlay, PA-C   12.5 mg at 06/21/12 2234  . nitroGLYCERIN (NITROSTAT) SL tablet 0.4 mg  0.4 mg Sublingual Q5 Min x 3 PRN Roxy Horseman, PA-C      . nitroGLYCERIN (NITROSTAT) SL tablet 0.4  mg  0.4 mg Sublingual Q5 Min x 3 PRN Wilburt Finlay, PA-C      . ondansetron (ZOFRAN) injection 4 mg  4 mg Intravenous Q6H PRN Wilburt Finlay, PA-C      . pantoprazole (PROTONIX) EC tablet 40 mg  40 mg Oral Daily Wilburt Finlay, PA-C      . sodium chloride 0.9 % injection 3 mL  3 mL Intravenous Q12H Bryan Hager, PA-C      . sodium chloride 0.9 % injection 3 mL  3 mL Intravenous PRN Wilburt Finlay, PA-C        PE: General appearance: alert, cooperative and no distress Lungs: clear to auscultation bilaterally Heart: regular rate and rhythm Extremities: no LEE Pulses: 2+ and symmetric Skin: warm and dry Neurologic: Grossly normal  Lab Results:   Recent Labs  06/21/12 1115 06/22/12 0520  WBC 4.7 4.9  HGB 13.2 11.5*  HCT 38.0 34.4*  PLT 194 166   BMET  Recent Labs  06/21/12 1115 06/22/12 0520  NA 139 141  K 3.6 3.7  CL 103 105  CO2 27 28  GLUCOSE 108* 102*  BUN 15 17  CREATININE 0.83 0.90  CALCIUM  9.2 8.7   PT/INR No results found for this basename: LABPROT, INR,  in the last 72 hours Cholesterol  Recent Labs  06/22/12 0520  CHOL 153   Cardiac Enzymes Cardiac Panel (last 3 results)  Recent Labs  06/21/12 1748 06/21/12 2210 06/22/12 0520  TROPONINI 0.38* 0.47* <0.30    Assessment/Plan  Principal Problem:   Unstable angina Active Problems:   HYPERTENSION   Cough  Plan: Pt has ruled in for NSTEMI. Troponin peaked at 0.47. She is on IV heparin. No further CP. Plan for diagnostic LHC today with possible PCI. She is NPO. HR and BP stable. Renal function is WNL. INR pending. Will need to monitor H/H, which has dropped from 13.2/38 to 11.5/34.4 over the course of several hours. Platelets are also down from 194 to 166. Will follow closely.    LOS: 1 day   Jo Johnson 06/22/2012 7:53 AM  I have seen and evaluated the patient this Am along with Boyce Medici, or Wilburt Finlay, [place]. I agree with her findings, examination as well as impression  recommendations.  No further CP.  Mild Troponin elevation overnight.  Exam essentially benign.  Son note that she has paroxysmal of SOB & coughing -- had a great deal of attention o/n by RN.  Sounds like potential GERD related aspiration.   Will need to monitor.  Per Dr. Hazle Coca recommendations, with + Troponin, would op for LHC+/- PCI as opposed to NST.   The procedure with Risks/Benefits/Alternatives and Indications was reviewed with the patient and son.  All questions were answered.    Risks / Complications include, but not limited to: Death, MI, CVA/TIA, VF/VT (with defibrillation), Bradycardia (need for temporary pacer placement), contrast induced nephropathy, bleeding / bruising / hematoma / pseudoaneurysm, vascular or coronary injury (with possible emergent CT or Vascular Surgery), adverse medication reactions, infection.    The patient and son voice understanding and agree to proceed.    MD Time with pt: 10 min  HARDING,DAVID W, M.D., M.S. [facility] [address] [place], [place]  [address]  [phone] Pager # [phone] 06/22/2012 8:45 AM

## 2012-06-22 NOTE — CV Procedure (Signed)
CARDIAC CATHETERIZATION AND PERCUTANEOUS CORONARY INTERVENTION REPORT  NAME:  Jo Johnson   MRN: [id number] DOB:  12-20-1924   ADMIT DATE: 06/21/2012 Procedure Date: 06/22/2012  INTERVENTIONAL CARDIOLOGIST: Marykay Lex, M.D., MS PRIMARY CARE PROVIDER: Sheryn Bison, MD PRIMARY CARDIOLOGIST:  Runell Gess., MD  PATIENT:  Jo Johnson is a 79 y.o. female a history of hypertension and acid reflux. She states she developed chest pain this morning after breakfast when she was laying on the couch, which she describes as a "hurting", along with pain in her back both arms, neck and dizziness.  She ruled in for ACS/NSTEMI with mildly elevated Troponin levels, and the decision was made to convert from Non-invasive Cardiolite ST to Cardiac Catheterization.   PRE-OPERATIVE DIAGNOSIS:    ACS/NSTEMI  PROCEDURES PERFORMED:    LEFT HEART CATHETERIZATION WITH NATIVE CORONARY ANGIOGRAPHY & LEFT VENTRICULOGRAPHY  PERCUTANEOUS CORONARY INTERVENTION - Proximal Circumflex with a Promus Premier DES 3.0 mm x 16 mm - dilated to 3.2 mm  PROCEDURE:Consent:  Risks of procedure as well as the alternatives and risks of each were explained to the (patient/caregiver).  Consent for procedure obtained. Consent for signed by MD and patient with RN witness -- placed on chart.   PROCEDURE: The patient was brought to the 2nd [facility] in the fasting state and prepped and draped in the usual sterile fashion for Right groin or radial access. A modified Allen's test with plethysmography was performed, revealing excellent Ulnar artery collateral flow.  Sterile technique was used including antiseptics, cap, gloves, gown, hand hygiene, mask and sheet.  Skin prep: Chlorhexidine.  Time Out: Verified patient identification, verified procedure, site/side was marked, verified correct patient position, special equipment/implants available, medications/allergies/relevent history reviewed,  required imaging and test results available.  Performed  Access: Right Radial Artery; 5 Fr Sheath -- Seldinger technique using an Angiocath Micropuncture Kit  IA Radial Cocktail 10 ml, IV Heparin 3000 Units administered. Diagnostic:  TIG 4.0, Angled Pigtail advanced over Versicore wire and exchanged over long-exchange safety-J-wire  Left & Right Coronary Artery Angiography: TIG 4.0  LV Hemodynamics (LV Gram): Angled Pigtail  TR Band:  1605 Hours, 14 mL air; non-occlusive hemostasis confirmed.  MEDICATIONS:  Anesthesia:  Local Lidocaine 2 ml  Sedation:  1 mg IV Versed, 50 mcg IV fentanyl ;   Omnipaque Contrast: 160 ml  Anticoagulation:    IV Heparin 3000 Units ;   Angiomax Bolus & drip  Anti-Platelet Agent:  Brilinta 180 mg  Hemodynamics:  Central Aortic / Mean Pressures: 130/49 mmHg; 79 mmHg  Left Ventricular Pressures / EDP: 128/2 mmHg; 9 mmHg  Left Ventriculography:  EF: 60-65% %  Wall Motion: normal  Coronary Anatomy:  Left Main: Large-caliber vessel, bifurcates into the LAD, and Circumflex; angiographically normal. LAD: Moderate large-caliber vessel with a proximal 20-30% stenosis followed by normal region and yet again another 30-40% stenosis. There is a small first septal perforator and a small first diagonal branch. The vessel then gives rise to a moderate-sized second diagonal branch just after a hinge point in the vessel. The LAD then tapers down to the apex and into a small vessel  With mild diffuse luminal irregularities distally.  D2: Moderate caliber vessel, there is a major diagonal branch. It is obese the same size the LAD at this point.mild diffuse luminal irregularities distally.  Left Circumflex: Very large caliber, nondominant vessel with a proximal hazy 85% stenosis that is the likely culprit lesion for non-STEMI. There are several  small branches off this vessel which essentially courses as a tortuous, large lateral OM. There is a short 80-90% focal  stenosis very distal in this vessel that is not approachable for PCI standpoint.   RCA: Large caliber, dominant vessel with diffuse proximal and mid stenoses. There is a long tubular, calcified 50-60% stenosis followed by a focal ectatic segment and then yet again another if tubular 60% stenosis followed by a normal segment prior to a focal 60% lesion just at a relatively proximal bifurcation into the Right PDA and the  Right Posterior AV Groove Branch (RPAV). Beyond this point the RCA system is relatively free of disease  RPDA: This is actually a dual system. The branch from the main RCA at the RPAV is actually relatively small vessel. There is a RV marginal branch that then courses down as a tandem PDA vessel. This vessel reaches almost all her to the apex and is a smaller moderate caliber vessel.  RPL Sysytem:The RPAV moderate caliber vessel gives rise to several right posterolateral branches. Mild diffuse luminal irregularities but nothing significant.  After reviewing the initial angiographic data with Dr. Allyson Sabal, the decision was made that the culprit lesion was indeed the proximal Circumflex lesion. With the plan to treat the diffuse RCA lesions medically, the decision was made to proceed with intervention on the proximal Circumflex lesion.  Percutaneous Coronary Intervention:  Sheath exchanged for 6 Fr Lesion: Proximal circumflex hazy, focal 85% likely thrombotic stenosis Guide: 6 Fr   XBLAD 3.5  Guidewire: Prowater Predilation Balloon: Emerge MR 2.0 mm x 12 mm;   8 Atm x 30 Sec Stent: Promus Premier DES 3.0 mm x 16 mm; the additional length was used simply to avoid the edges the stent being in the possible bed of the vessel.  Deployment: 12 Atm x 30 Sec,  Post-dilation with Stent Balloon: 16 mm x 60 mm;   Final Diameter: 3.2 mm  Post deployment angiography in multiple views, with and without guidewire in place revealed excellent stent deployment and lesion coverage.  There was no  evidence of dissection or perforation.  PATIENT DISPOSITION:    The patient was transferred to the PACU holding area in a hemodynamicaly stable, chest pain free condition.  The patient tolerated the procedure well, and there were no complications.  EBL:   < 10 ml  The patient was stable before, during, and after the procedure.  POST-OPERATIVE DIAGNOSIS:    Severe 2 vessel disease, with the likely culprit lesion being a focal hazy ~85-90% proximal Circumflex stenosis treated with PCI with a distal OM ~90% stenosis, along with extensive moderate to severe Proximal to Mid RCA disease.  Successful PCI of the Proximal Circumflex with a Promus Premier DES 3.0 mm x 18 mm - post-dilated to 3.2 mm.  Moderate LAD disease  Normal LV Function with normal LVEDP.  PLAN OF CARE:  Monitor overnight in SDU/TCU post Radial Cath PCI.  Anticipate d/c in AM.  DAPT for minimum of 1 yr.  Continue to titrate cardiac medications.  Dr. Allyson Sabal & Dr. Jarold Motto have been updated. Family updated.   Marykay Lex, M.D., M.S. [facility] [address] [place], [place]  [address]  [phone]  06/22/2012 4:15 PM

## 2012-06-23 DIAGNOSIS — R079 Chest pain, unspecified: Secondary | ICD-10-CM | POA: Diagnosis not present

## 2012-06-23 DIAGNOSIS — I214 Non-ST elevation (NSTEMI) myocardial infarction: Secondary | ICD-10-CM

## 2012-06-23 DIAGNOSIS — K219 Gastro-esophageal reflux disease without esophagitis: Secondary | ICD-10-CM | POA: Diagnosis not present

## 2012-06-23 DIAGNOSIS — I200 Unstable angina: Secondary | ICD-10-CM | POA: Diagnosis not present

## 2012-06-23 DIAGNOSIS — I10 Essential (primary) hypertension: Secondary | ICD-10-CM | POA: Diagnosis not present

## 2012-06-23 DIAGNOSIS — F17200 Nicotine dependence, unspecified, uncomplicated: Secondary | ICD-10-CM | POA: Diagnosis not present

## 2012-06-23 LAB — CBC
HCT: 36.5 % (ref 36.0–46.0)
MCV: 87.1 fL (ref 78.0–100.0)
RBC: 4.19 MIL/uL (ref 3.87–5.11)
WBC: 5.3 10*3/uL (ref 4.0–10.5)

## 2012-06-23 LAB — BASIC METABOLIC PANEL
BUN: 11 mg/dL (ref 6–23)
CO2: 28 mEq/L (ref 19–32)
Chloride: 103 mEq/L (ref 96–112)
Creatinine, Ser: 0.84 mg/dL (ref 0.50–1.10)

## 2012-06-23 MED ORDER — PANTOPRAZOLE SODIUM 40 MG PO TBEC: 40.0000 mg | DELAYED_RELEASE_TABLET | Freq: Every day | ORAL | Status: DC

## 2012-06-23 MED ORDER — TICAGRELOR 90 MG PO TABS: 90.0000 mg | ORAL_TABLET | Freq: Two times a day (BID) | ORAL | Status: DC

## 2012-06-23 MED ORDER — NITROGLYCERIN 0.4 MG SL SUBL: 0.4000 mg | SUBLINGUAL_TABLET | SUBLINGUAL | Status: DC | PRN

## 2012-06-23 MED ORDER — ATORVASTATIN CALCIUM 10 MG PO TABS: 10.0000 mg | ORAL_TABLET | Freq: Every day | ORAL | Status: DC

## 2012-06-23 MED ORDER — ASPIRIN 81 MG PO TBEC: 81.0000 mg | DELAYED_RELEASE_TABLET | Freq: Every day | ORAL | Status: DC

## 2012-06-23 MED ORDER — METOPROLOL TARTRATE 12.5 MG HALF TABLET: 12.5000 mg | ORAL_TABLET | Freq: Two times a day (BID) | ORAL | Status: DC

## 2012-06-23 MED FILL — Sodium Chloride IV Soln 0.9%: INTRAVENOUS | Qty: 50 | Status: AC

## 2012-06-23 NOTE — Progress Notes (Signed)
D/c teaching given to pt & pt's son; no further questions or comments from pt/family; all pt belongings given to son; emotional support given to pt & to family; pt taken out in wheelchair

## 2012-06-23 NOTE — Progress Notes (Signed)
TR BAND REMOVAL  LOCATION:    right radial  DEFLATED PER PROTOCOL:    yes  TIME BAND OFF / DRESSING APPLIED:    21:30   SITE UPON ARRIVAL:    Level 0  SITE AFTER BAND REMOVAL:    Level 0  REVERSE ALLEN'S TEST:     positive  CIRCULATION SENSATION AND MOVEMENT:    Within Normal Limits   yes  COMMENTS:

## 2012-06-23 NOTE — Progress Notes (Signed)
[facility]  Subjective: No further chest pain.  Objective: Vital signs in last 24 hours: Temp:  [97.8 F (36.6 C)-98.6 F (37 C)] 98.2 F (36.8 C) (06/03 0351) Pulse Rate:  [48-72] 68 (06/03 0506) Resp:  [18-26] 22 (06/03 0506) BP: (100-166)/(41-92) 118/92 mmHg (06/03 0506) SpO2:  [96 %-100 %] 100 % (06/03 0506) Last BM Date: 06/23/12  Intake/Output from previous day: 06/02 0701 - 06/03 0700 In: 360.8 [P.O.:120; I.V.:240.8] Out: 1700 [Urine:1700] Intake/Output this shift:    Medications Current Facility-Administered Medications  Medication Dose Route Frequency Provider Last Rate Last Dose  . 0.9 %  sodium chloride infusion  250 mL Intravenous PRN Marykay Lex, MD      . acetaminophen (TYLENOL) tablet 650 mg  650 mg Oral Q4H PRN Wilburt Finlay, PA-C      . amLODipine (NORVASC) tablet 5 mg  5 mg Oral Daily Wilburt Finlay, PA-C   5 mg at 06/22/12 1000  . aspirin EC tablet 81 mg  81 mg Oral Daily Wilburt Finlay, PA-C      . atorvastatin (LIPITOR) tablet 10 mg  10 mg Oral q1800 Wilburt Finlay, PA-C   10 mg at 06/22/12 2246  . irbesartan (AVAPRO) tablet 300 mg  300 mg Oral Daily Wilburt Finlay, PA-C   300 mg at 06/22/12 1001  . levalbuterol (XOPENEX) nebulizer solution 0.63 mg  0.63 mg Nebulization Q8H PRN Wilburt Finlay, PA-C      . metoprolol tartrate (LOPRESSOR) tablet 12.5 mg  12.5 mg Oral BID Wilburt Finlay, PA-C   12.5 mg at 06/22/12 2245  . morphine 2 MG/ML injection 1 mg  1 mg Intravenous Q3H PRN Marykay Lex, MD      . nitroGLYCERIN (NITROSTAT) SL tablet 0.4 mg  0.4 mg Sublingual Q5 Min x 3 PRN Roxy Horseman, PA-C      . nitroGLYCERIN (NITROSTAT) SL tablet 0.4 mg  0.4 mg Sublingual Q5 Min x 3 PRN Wilburt Finlay, PA-C      . ondansetron [facility]) injection 4 mg  4 mg Intravenous Q6H PRN Wilburt Finlay, PA-C   4 mg at 06/22/12 1835  . pantoprazole (PROTONIX) EC tablet 40 mg  40 mg Oral Daily Wilburt Finlay, PA-C   40 mg at 06/22/12 1001  . sodium chloride 0.9 %  injection 3 mL  3 mL Intravenous Q12H Marykay Lex, MD   3 mL at 06/22/12 2249  . sodium chloride 0.9 % injection 3 mL  3 mL Intravenous PRN Marykay Lex, MD      . Ticagrelor [facility]) tablet 90 mg  90 mg Oral BID Marykay Lex, MD   90 mg at 06/22/12 2248    PE: General appearance: alert, cooperative and no distress Lungs: clear to auscultation bilaterally Heart: regular rate and rhythm Extremities: no LEE Pulses: 2+ and symmetric Skin: warm and dry Neurologic: Grossly normal  Lab Results:   Recent Labs  06/21/12 1115 06/22/12 0520 06/23/12 0400  WBC 4.7 4.9 5.3  HGB 13.2 11.5* 12.5  HCT 38.0 34.4* 36.5  PLT 194 166 173   BMET  Recent Labs  06/21/12 1115 06/22/12 0520 06/23/12 0400  NA 139 141 141  K 3.6 3.7 3.6  CL 103 105 103  CO2 27 28 28   GLUCOSE 108* 102* 106*  BUN 15 17 11   CREATININE 0.83 0.90 0.84  CALCIUM 9.2 8.7 9.3   PT/INR No results found for this basename: LABPROT, INR,  in the last 72 hours  Cholesterol  Recent Labs  06/22/12 0520  CHOL 153   Cardiac Enzymes Cardiac Panel (last 3 results)  Recent Labs  06/21/12 1748 06/21/12 2210 06/22/12 0520  TROPONINI 0.38* 0.47* <0.30    Studies/Results:  Diagnostic LHC w/ PCI 06/22/12 POST-OPERATIVE DIAGNOSIS:  Severe 2 vessel disease, with the likely culprit lesion being a focal hazy ~85-90% proximal Circumflex stenosis treated with PCI with a distal OM ~90% stenosis, along with extensive moderate to severe Proximal to Mid RCA disease.  Successful PCI of the Proximal Circumflex with a Promus Premier DES 3.0 mm x 18 mm - post-dilated to 3.2 mm.  Moderate LAD disease  Normal LV Function with normal LVEDP.   Assessment/Plan  Principal Problem:   NSTEMI - S/P PCI + DES to Prox Circumflex Active Problems:   HYPERTENSION   Cough   Unstable angina  Plan: S/P LHC in the setting of NSTEMI. The cath, performed by Dr. Herbie Baltimore demonstrated severe 2 vessel disease, with the likely  culprit lesion being a focal hazy ~85-90% proximal Circumflex stenosis, with a distal OM ~90% stenosis, along with extensive moderate to severe Proximal to Mid RCA disease.  She had moderate LAD disease. She underwent successful PCI of the Proximal Circumflex with a Promus Premier DES 3.0 mm x 18 mm - post-dilated to 3.2 mm. She had normal LV Function with normal LVEDP. She is on DAPT with ASA + Brilinta. No further chest pain. Right radial access site is stable. NSR on telemetry. HR and BP stable. Will get cardiac rehab to ambulate today. If stable, consider d/c later today or early tomorrow. Pt is on a good medical regimen. Continue with BB, ARB, statin and DAPT.     LOS: 2 days    Shannen Vernon M. Sharol Harness, PA-C 06/23/2012 8:14 AM

## 2012-06-23 NOTE — Progress Notes (Signed)
CARDIAC REHAB PHASE I   PRE:  Rate/Rhythm: 84SR  BP:  Supine:   Sitting: 177/45  Standing:    SaO2: 97%RA  MODE:  Ambulation: 390 ft   POST:  Rate/Rhythm: 104ST  BP:  Supine:   Sitting: 142/59  Standing:    SaO2: 95%RA 0845-1000 Pt is very impulsive and unsafe. Does not seem to be aware of unsafe actions. Walked 390 ft on RA with her cane and I used gait belt. Pt carried cane at times and feet tangled as she became distracted. Told her she would have to walk with someone and could not walk alone until she was stronger. Son in room during this discussion. After I told pt she needed to have someone to assist her, she got her comb and started to walk a few steps while hooked up to monitor, BP cuff, and pulse ox. Assisted her back to chair and again discussed falling and that she was too unsteady to be up by herself. Assisted back to recliner. Pt laughing and said she would not fall. Education completed with son present as pt not able to comprehend all information. Will need re enforcement by family. Gave brilinta packet to son and stressed with pt importance of taking. Reviewed NTG use and calling 911. Not appropriate for CRP 2 as pt does not drive and would be unsafe at this time with equipment. Discussed not using snuff but pt seems unready to give up.   Luetta Nutting, RN BSN  06/23/2012 9:52 AM

## 2012-06-23 NOTE — Progress Notes (Signed)
Pt. Seen and examined. Agree with the NP/PA-C note as written.   She feels great today. Had some confusion overnight, per son, but may be sundowning. Seems clear today. Radial cath site intact, no hematoma, good distal pulse. She seems to be doing better with regard to GERD on protonix, I would continue this at home after discharge. Ambulate today with rehab - plan probable d/c home this afternoon. Follow-up in 7-10 days in the office.  Chrystie Nose, MD, [facility] Attending Cardiologist [facility]

## 2012-06-23 NOTE — Discharge Summary (Signed)
Physician Discharge Summary  Patient ID: Jo Johnson MRN: [id number] DOB/AGE: 03-13-1924 79 y.o.  Admit date: 06/21/2012 Discharge date: 06/23/2012  Admission Diagnoses: NSTEMI  Discharge Diagnoses:  Principal Problem:   NSTEMI - S/P PCI + DES to Prox Circumflex Active Problems:   HYPERTENSION   Cough   Unstable angina   Discharged Condition: stable  Hospital Course: Jo Johnson is a 79 y.o. Female with a history of hypertension and acid reflux, who presented to [facility] on 06/21/12 with a complaint of chest pain with radiation to both upper extremities and associated dizziness. Her EKG was normal and without acute changes. Her initial troponin was negative. However, she was admitted for obsevation and for MI rule-out. The initial plan was to have the patient undergo nuclear stress testing if serial troponins were negative. However, she ruled in for ACS/NSTEMI with mildly elevated troponin levels, and the decision was made to convert from non-invasive Cardiolite stress test to cardiac catheterization. Her troponin peaked at 0.47. The procedure was performed by Dr. Herbie Baltimore, via the right radial artery. The catheterization demonstrated severe 2 vessel disease. The cultprit vessel was a 85-90% proximal stenosis of the Circumflex artery. This was successfully treated with PCI with a DES. She was also noted to have a distal OM that was 90% stenosed, along with extensive moderate to severe proximal to mid RCA disease. She had only moderate LAD disease. She had normal LV function with normal LVEDP. The plan was to treat the diffuse RCA lesions medically. She left the cath lab in stable condition and was started on dual antiplatelet therapy with ASA and Brilinta. A beta blocker and a stain were initiated. She tolerated the additions well.  She had no further chest pain. Her right radial access site remained stable. She was assessed by cardiac rehab. She had no exertional chest pain or shortness of breath  with ambulation.  She was last seen and examined by Dr. Rennis Golden, who determined she was stable for discharge home. She will follow up at [facility] with Wilburt Finlay, PA-C, on 07/01/12.   Consults: None  Significant Diagnostic Studies:   LHC 06/22/12 POST-OPERATIVE DIAGNOSIS:  Severe 2 vessel disease, with the likely culprit lesion being a focal hazy ~85-90% proximal Circumflex stenosis treated with PCI with a distal OM ~90% stenosis, along with extensive moderate to severe Proximal to Mid RCA disease.  Successful PCI of the Proximal Circumflex with a Promus Premier DES 3.0 mm x 18 mm - post-dilated to 3.2 mm.  Moderate LAD disease  Normal LV Function with normal LVEDP.  Treatments: See Hospital Course  Discharge Exam: Blood pressure 135/74, pulse 60, temperature 98.6 F (37 C), temperature source Oral, resp. rate 18, height 5' 2.99" (1.6 m), weight 132 lb 11.5 oz (60.2 kg), SpO2 100.00%.  Disposition:   Discharge Orders   Future Appointments Provider Department Dept Phone   07/01/2012 10:40 AM Wilburt Finlay, PA-C [facility] [place] [phone]   Future Orders Complete By Expires     Diet - low sodium heart healthy  As directed     Driving Restrictions  As directed     Comments:      No driving until seen at follow-up appointment    Increase activity slowly  As directed     Lifting restrictions  As directed     Comments:      No lifting more than 1/2 gallon of milk for 3 days        Medication List  STOP taking these medications       omeprazole 20 MG capsule  Commonly known as:  PRILOSEC  Replaced by:  pantoprazole 40 MG tablet      TAKE these medications       amLODipine 5 MG tablet  Commonly known as:  NORVASC  Take 5 mg by mouth daily.     aspirin 81 MG EC tablet  Take 1 tablet (81 mg total) by mouth daily.     atorvastatin 10 MG tablet  Commonly known as:  LIPITOR  Take 1 tablet (10 mg total) by mouth daily at 6 PM.     metoprolol  tartrate 12.5 mg Tabs  Commonly known as:  LOPRESSOR  Take 0.5 tablets (12.5 mg total) by mouth 2 (two) times daily.     nitroGLYCERIN 0.4 MG SL tablet  Commonly known as:  NITROSTAT  Place 1 tablet (0.4 mg total) under the tongue every 5 (five) minutes x 3 doses as needed for chest pain.     pantoprazole 40 MG tablet  Commonly known as:  PROTONIX  Take 1 tablet (40 mg total) by mouth daily.     Ticagrelor 90 MG Tabs tablet  Commonly known as:  BRILINTA  Take 1 tablet (90 mg total) by mouth 2 (two) times daily.     Ticagrelor 90 MG Tabs tablet  Commonly known as:  BRILINTA  Take 1 tablet (90 mg total) by mouth 2 (two) times daily.     valsartan 320 MG tablet  Commonly known as:  DIOVAN  Take 320 mg by mouth daily.           Follow-up Information   Follow up with HAGER, BRYAN, PA-C On 07/01/2012. (10:40 am)    Contact information:   [address] [place] [address] [phone]      TIME SPENT ON DISCHARGE, INCLUDING PHYSICIAN TIME: > 30 MINUTES Signed: Allayne Butcher, PA-C 06/23/2012, 4:45 PM

## 2012-07-01 ENCOUNTER — Ambulatory Visit (INDEPENDENT_AMBULATORY_CARE_PROVIDER_SITE_OTHER): Payer: Medicare Other | Admitting: Physician Assistant

## 2012-07-01 ENCOUNTER — Encounter: Payer: Self-pay | Admitting: Physician Assistant

## 2012-07-01 VITALS — BP 120/60 | HR 60 | Ht 63.0 in | Wt 131.6 lb

## 2012-07-01 DIAGNOSIS — Z9889 Other specified postprocedural states: Secondary | ICD-10-CM

## 2012-07-01 DIAGNOSIS — I10 Essential (primary) hypertension: Secondary | ICD-10-CM | POA: Diagnosis not present

## 2012-07-01 DIAGNOSIS — I2510 Atherosclerotic heart disease of native coronary artery without angina pectoris: Secondary | ICD-10-CM | POA: Diagnosis not present

## 2012-07-01 NOTE — Assessment & Plan Note (Addendum)
Currently treated with aspirin, Brilinta, Lopressor, Diovan, amlodipine.  LV gram indicated normal LV function at the time of cath.  This appear stable and euvolemic.  Patient should be contacted by cardiac rehabilitation for further treatment.

## 2012-07-01 NOTE — Patient Instructions (Signed)
Follow up with Dr. Berry in 3 months 

## 2012-07-01 NOTE — Progress Notes (Signed)
Date:  07/01/2012   ID:  Jo Johnson, DOB 29-Mar-1924, MRN [id number]  PCP:  Sheryn Bison, MD  Primary Cardiologist:  Allyson Sabal    History of Present Illness: Jo Johnson is a 79 y.o. female with a history of hypertension and acid reflux, who presented to [facility] on 06/21/12 with a complaint of chest pain with radiation to both upper extremities and associated dizziness. Her EKG was normal and without acute changes. Her initial troponin was negative. However, she was admitted for obsevation and for MI rule-out. The initial plan was to have the patient undergo nuclear stress testing if serial troponins were negative. However, she ruled in for ACS/NSTEMI with mildly elevated troponin levels, and the decision was made to convert from non-invasive Cardiolite stress test to cardiac catheterization. Her troponin peaked at 0.47. The procedure was performed by Dr. Herbie Baltimore, via the right radial artery. The catheterization demonstrated severe 2 vessel disease. The cultprit vessel was a 85-90% proximal stenosis of the Circumflex artery. This was successfully treated with PCI with a DES. She was also noted to have a distal OM that was 90% stenosed, along with extensive moderate to severe proximal to mid RCA disease. She had only moderate LAD disease. She had normal LV function with normal LVEDP. The plan was to treat the diffuse RCA lesions medically. She left the cath lab in stable condition and was started on dual antiplatelet therapy with ASA and Brilinta. A beta blocker and a stain were initiated. She was assessed by cardiac rehab.  She presents today for followup to her hospitalization and MI.  According to her son, who has been staying with her, she does have some chronic shortness of breath which has had for years but has had not had any worsening symptoms.  She and her son also reported that she had some chest discomfort with left arm pain which he noticed after waking up and may nap when she was lying on  her side on the couch. The pain resolved after moving her arm and adjusting her body position.  She's also gets tired while doing the clothes washing.  She currently denies nausea, vomiting, fever, orthopnea, dizziness, PND, cough, congestion, abdominal pain, hematochezia, melena, lower extremity edema, claudication.    Wt Readings from Last 3 Encounters:  07/01/12 131 lb 9.6 oz (59.693 kg)  06/22/12 132 lb 11.5 oz (60.2 kg)  06/22/12 132 lb 11.5 oz (60.2 kg)     Past Medical History  Diagnosis Date  . Hypertension   . Acid reflux   . Asthma   . Chronic cough     Current Outpatient Prescriptions  Medication Sig Dispense Refill  . amLODipine (NORVASC) 5 MG tablet Take 5 mg by mouth daily.      Marland Kitchen aspirin EC 81 MG EC tablet Take 1 tablet (81 mg total) by mouth daily.      Marland Kitchen atorvastatin (LIPITOR) 10 MG tablet Take 1 tablet (10 mg total) by mouth daily at 6 PM.  30 tablet  5  . metoprolol tartrate (LOPRESSOR) 12.5 mg TABS Take 0.5 tablets (12.5 mg total) by mouth 2 (two) times daily.  60 tablet  5  . nitroGLYCERIN (NITROSTAT) 0.4 MG SL tablet Place 1 tablet (0.4 mg total) under the tongue every 5 (five) minutes x 3 doses as needed for chest pain.  25 tablet  2  . pantoprazole (PROTONIX) 40 MG tablet Take 1 tablet (40 mg total) by mouth daily.  30 tablet  5  .  Ticagrelor (BRILINTA) 90 MG TABS tablet Take 1 tablet (90 mg total) by mouth 2 (two) times daily.  60 tablet  0  . valsartan (DIOVAN) 320 MG tablet Take 320 mg by mouth daily.       No current facility-administered medications for this visit.    Allergies:   No Known Allergies  Social History:  The patient  reports that she has never smoked. Her smokeless tobacco use includes Chew. She reports that she does not drink alcohol or use illicit drugs.   Family History:  Noncontributory  ROS:  Please see the history of present illness.  All other systems reviewed and negative.   PHYSICAL EXAM: VS:  BP 120/60  Pulse 60  Ht 5\' 3"   (1.6 m)  Wt 131 lb 9.6 oz (59.693 kg)  BMI 23.32 kg/m2 Well nourished, well developed, in no acute distress HEENT: Pupils are equal round react to light accommodation extraocular movements are intact.  Neck: no JVDNo cervical lymphadenopathy. Cardiac: Regular rate and rhythm without murmurs rubs or gallops. Lungs:  clear to auscultation bilaterally, no wheezing, rhonchi or rales Abd: soft, nontender, positive bowel sounds all quadrants, no hepatosplenomegaly Ext: no lower extremity edema.  2+ radial and dorsalis pedis pulses. Skin: warm and dry Neuro:  Grossly normal  EKG:  Sinus rhythm nonspecific lateral T-wave changes rate 60 beats per minute  ASSESSMENT AND PLAN:  Problem List Items Addressed This Visit   CAD (coronary artery disease)     Currently treated with aspirin, Brilinta, Lopressor, Diovan, amlodipine.  LV gram indicated normal LV function at the time of cath.  This appear stable and euvolemic.  Patient should be contacted by cardiac rehabilitation for further treatment.    Relevant Orders      EKG 12-Lead   HYPERTENSION - Primary     Patient's blood pressure is well controlled on appropriate medications. I did discuss sodium restriction with the patient and her son.       Other Visit Diagnoses   Status post cardiac catheterization        Relevant Orders       EKG 12-Lead

## 2012-07-01 NOTE — Assessment & Plan Note (Signed)
Patient's blood pressure is well controlled on appropriate medications. I did discuss sodium restriction with the patient and her son.

## 2012-07-07 ENCOUNTER — Telehealth: Payer: Self-pay | Admitting: Cardiovascular Disease

## 2012-07-07 NOTE — Telephone Encounter (Signed)
Returned call and spoke w/ Jo Johnson, pt's son.  Stated pt c/o chest hurting yesterday and took NTG.  Stated she said it went away and today is feeling fine.  Stated pt said if she could have belched, then she felt like it would have gone away.  Stated she sounds good.  RN explained use of NTG and when pt should go to ER (3 NTG in ) and when to call the office (need to take NTG several times/week, but not 3 in ).  Verbalized understanding.

## 2012-07-07 NOTE — Telephone Encounter (Signed)
Patient had light heart attack about  3 week ago.  Had to use Nitro yesterday and her chest discomfort went away.  Does she need t be concerned?

## 2012-07-14 ENCOUNTER — Telehealth: Payer: Self-pay | Admitting: Cardiovascular Disease

## 2012-07-14 NOTE — Telephone Encounter (Signed)
C/o left hip and leg pain, worse last PM.  Suggested she try tylenol for pain relief.  If this does not resolve instructed to contact her PCP.  Voiced understanding

## 2012-07-14 NOTE — Telephone Encounter (Signed)
Wants to know what can she take for hip-its been hurting-started last night!Had a heart attack  about 3wks ago-that's why she is concerned about what she takes!

## 2012-07-15 NOTE — Telephone Encounter (Signed)
Returned call and spoke w/ Maisie Fus, pt's son.  Stated pt wants to know if she can take tylenol for hip pain.  Stated he thinks she was told she can take tylenol for pain when she was discharged from the hospital, but he wanted to confirm before he told pt.  Informed son that Britta Mccreedy, [place] returned call and advised tylenol for pain and if not resolved to contact PCP.  Son verbalized understanding.

## 2012-07-15 NOTE — Telephone Encounter (Signed)
Still trying to find out what can she take for the pain in her hip-very concerned about what she takes-since she just recently had a heart attack!

## 2012-07-21 ENCOUNTER — Other Ambulatory Visit: Payer: Self-pay | Admitting: *Deleted

## 2012-07-21 ENCOUNTER — Other Ambulatory Visit (HOSPITAL_COMMUNITY): Payer: Self-pay | Admitting: Cardiology

## 2012-07-21 NOTE — Telephone Encounter (Signed)
Rx was sent to pharmacy electronically. 

## 2012-10-10 ENCOUNTER — Other Ambulatory Visit (HOSPITAL_COMMUNITY): Payer: Self-pay | Admitting: Cardiology

## 2012-10-12 NOTE — Telephone Encounter (Signed)
Rx was sent to pharmacy electronically. 

## 2012-10-16 ENCOUNTER — Encounter: Payer: Self-pay | Admitting: Cardiovascular Disease

## 2012-10-16 ENCOUNTER — Ambulatory Visit (INDEPENDENT_AMBULATORY_CARE_PROVIDER_SITE_OTHER): Payer: Medicare Other | Admitting: Cardiovascular Disease

## 2012-10-16 VITALS — BP 144/60 | HR 59 | Ht 63.0 in | Wt 122.0 lb

## 2012-10-16 DIAGNOSIS — E785 Hyperlipidemia, unspecified: Secondary | ICD-10-CM | POA: Diagnosis not present

## 2012-10-16 DIAGNOSIS — I10 Essential (primary) hypertension: Secondary | ICD-10-CM

## 2012-10-16 DIAGNOSIS — I2510 Atherosclerotic heart disease of native coronary artery without angina pectoris: Secondary | ICD-10-CM | POA: Diagnosis not present

## 2012-10-16 DIAGNOSIS — I214 Non-ST elevation (NSTEMI) myocardial infarction: Secondary | ICD-10-CM

## 2012-10-16 NOTE — Patient Instructions (Addendum)
Your physician wants you to follow-up in: 6 months with Bryan Hager PA and 1 year with Dr Berry.  You will receive a reminder letter in the mail two months in advance. If you don't receive a letter, please call our office to schedule the follow-up appointment.  

## 2012-10-16 NOTE — Assessment & Plan Note (Signed)
Patient had a non-STEMI back in June with cardiac cath performed record Jo Johnson via the right radial approach revealing a height of a proximal circumflex lesion which was stented with a drug-eluting stent. She also had moderate to severe proximal to mid RCA disease and normal LV function. Plan was for medical therapy for the remainder of her CAD. She has had 4 episodes of nitroglycerin responsive angina since her last office visit with Huey Bienenstock PA-C 07/01/12.

## 2012-10-16 NOTE — Assessment & Plan Note (Signed)
Well-controlled on current medications 

## 2012-10-16 NOTE — Assessment & Plan Note (Signed)
On statin therapy with recent fasting lipid profile performed on 06/22/12 revealed a total cholesterol of 53, LDL of 88 and HDL of 45

## 2012-10-16 NOTE — Progress Notes (Signed)
10/16/2012 Jo Johnson   1924/10/13  [id number]  Primary Physician Sheryn Bison, MD Primary Cardiologist: Runell Gess MD Roseanne Reno   HPI:  Jo Johnson is a 79 y.o. female with a history of hypertension and acid reflux, who presented to [facility] on 06/21/12 with a complaint of chest pain with radiation to both upper extremities and associated dizziness. Her EKG was normal and without acute changes. Her initial troponin was negative. However, she was admitted for obsevation and for MI rule-out. The initial plan was to have the patient undergo nuclear stress testing if serial troponins were negative. However, she ruled in for ACS/NSTEMI with mildly elevated troponin levels, and the decision was made to convert from non-invasive Cardiolite stress test to cardiac catheterization. Her troponin peaked at 0.47. The procedure was performed by Dr. Herbie Baltimore, via the right radial artery. The catheterization demonstrated severe 2 vessel disease. The cultprit vessel was a 85-90% proximal stenosis of the Circumflex artery. This was successfully treated with PCI with a DES. She was also noted to have a distal OM that was 90% stenosed, along with extensive moderate to severe proximal to mid RCA disease. She had only moderate LAD disease. She had normal LV function with normal LVEDP. The plan was to treat the diffuse RCA lesions medically. She left the cath lab in stable condition and was started on dual antiplatelet therapy with ASA and Brilinta. A beta blocker and a stain were initiated. She was assessed by cardiac rehab.  She presents today for followup to her hospitalization and MI. According to her son, who has been staying with her, she does have some chronic shortness of breath which has had for years but has had not had any worsening symptoms. She and her son also reported that she had some chest discomfort with left arm pain which he noticed after waking up and may nap when she was lying on  her side on the couch. The pain resolved after moving her arm and adjusting her body position. She's also gets tired while doing the clothes washing.  She saw Huey Bienenstock [facility] in the office on 07/01/12. She's had 4 episodes of nitroglycerin responsive angina since that time but otherwise has done well.    Current Outpatient Prescriptions  Medication Sig Dispense Refill  . amLODipine (NORVASC) 5 MG tablet Take 5 mg by mouth daily.      Marland Kitchen aspirin EC 81 MG EC tablet Take 1 tablet (81 mg total) by mouth daily.      Marland Kitchen atorvastatin (LIPITOR) 10 MG tablet TAKE 1 TABLET BY MOUTH DAILY AT 6 PM  90 tablet  3  . BRILINTA 90 MG TABS tablet TAKE 1 TABLET BY MOUTH TWICE DAILY  60 tablet  11  . metoprolol tartrate (LOPRESSOR) 25 MG tablet TAKE 1/2 TABLET BY MOUTH TWICE DAILY  90 tablet  3  . nitroGLYCERIN (NITROSTAT) 0.4 MG SL tablet Place 1 tablet (0.4 mg total) under the tongue every 5 (five) minutes x 3 doses as needed for chest pain.  25 tablet  2  . pantoprazole (PROTONIX) 40 MG tablet Take 1 tablet (40 mg total) by mouth daily.  30 tablet  5  . valsartan (DIOVAN) 320 MG tablet Take 320 mg by mouth daily.       No current facility-administered medications for this visit.    No Known Allergies  History   Social History  . Marital Status: Divorced    Spouse Name: N/A    Number  of Children: N/A  . Years of Education: N/A   Occupational History  . Not on file.   Social History Main Topics  . Smoking status: Never Smoker   . Smokeless tobacco: Current User    Types: Chew  . Alcohol Use: No  . Drug Use: No  . Sexual Activity: No   Other Topics Concern  . Not on file   Social History Narrative  . No narrative on file     Review of Systems: General: negative for chills, fever, night sweats or weight changes.  Cardiovascular: negative for chest pain, dyspnea on exertion, edema, orthopnea, palpitations, paroxysmal nocturnal dyspnea or shortness of breath Dermatological: negative for  rash Respiratory: negative for cough or wheezing Urologic: negative for hematuria Abdominal: negative for nausea, vomiting, diarrhea, bright red blood per rectum, melena, or hematemesis Neurologic: negative for visual changes, syncope, or dizziness All other systems reviewed and are otherwise negative except as noted above.    Blood pressure 144/60, pulse 59, height 5\' 3"  (1.6 m), weight 122 lb (55.339 kg).  General appearance: alert and no distress Neck: no adenopathy, no carotid bruit, no JVD, supple, symmetrical, trachea midline and thyroid not enlarged, symmetric, no tenderness/mass/nodules Lungs: clear to auscultation bilaterally Heart: regular rate and rhythm, S1, S2 normal, no murmur, click, rub or gallop Extremities: extremities normal, atraumatic, no cyanosis or edema  EKG sinus bradycardia at 59 with an occasional PVC  ASSESSMENT AND PLAN:   NSTEMI - S/P PCI + DES to Prox Circumflex Patient had a non-STEMI back in June with cardiac cath performed record Bryan Lemma via the right radial approach revealing a height of a proximal circumflex lesion which was stented with a drug-eluting stent. She also had moderate to severe proximal to mid RCA disease and normal LV function. Plan was for medical therapy for the remainder of her CAD. She has had 4 episodes of nitroglycerin responsive angina since her last office visit with Huey Bienenstock PA-C 07/01/12.  HYPERTENSION Well-controlled on current medications  Hyperlipidemia On statin therapy with recent fasting lipid profile performed on 06/22/12 revealed a total cholesterol of 53, LDL of 88 and HDL of 45      Runell Gess MD [facility] 10/16/2012 11:31 AM

## 2012-12-10 ENCOUNTER — Other Ambulatory Visit (HOSPITAL_COMMUNITY): Payer: Self-pay | Admitting: Cardiology

## 2012-12-10 NOTE — Telephone Encounter (Signed)
Rx was sent to pharmacy electronically. 

## 2012-12-14 ENCOUNTER — Other Ambulatory Visit: Payer: Self-pay | Admitting: Cardiology

## 2012-12-14 NOTE — Telephone Encounter (Signed)
Rx was sent to pharmacy electronically. 

## 2012-12-21 DIAGNOSIS — R82998 Other abnormal findings in urine: Secondary | ICD-10-CM | POA: Diagnosis not present

## 2012-12-21 DIAGNOSIS — E785 Hyperlipidemia, unspecified: Secondary | ICD-10-CM | POA: Diagnosis not present

## 2012-12-21 DIAGNOSIS — R7309 Other abnormal glucose: Secondary | ICD-10-CM | POA: Diagnosis not present

## 2012-12-21 DIAGNOSIS — I10 Essential (primary) hypertension: Secondary | ICD-10-CM | POA: Diagnosis not present

## 2012-12-28 DIAGNOSIS — Z23 Encounter for immunization: Secondary | ICD-10-CM | POA: Diagnosis not present

## 2012-12-28 DIAGNOSIS — Z1331 Encounter for screening for depression: Secondary | ICD-10-CM | POA: Diagnosis not present

## 2012-12-28 DIAGNOSIS — K219 Gastro-esophageal reflux disease without esophagitis: Secondary | ICD-10-CM | POA: Diagnosis not present

## 2012-12-28 DIAGNOSIS — IMO0002 Reserved for concepts with insufficient information to code with codable children: Secondary | ICD-10-CM | POA: Diagnosis not present

## 2012-12-28 DIAGNOSIS — Z0000 Encounter for general adult medical examination without abnormal findings: Secondary | ICD-10-CM | POA: Diagnosis not present

## 2012-12-28 DIAGNOSIS — E785 Hyperlipidemia, unspecified: Secondary | ICD-10-CM | POA: Diagnosis not present

## 2012-12-28 DIAGNOSIS — R634 Abnormal weight loss: Secondary | ICD-10-CM | POA: Diagnosis not present

## 2012-12-28 DIAGNOSIS — R7309 Other abnormal glucose: Secondary | ICD-10-CM | POA: Diagnosis not present

## 2012-12-28 DIAGNOSIS — I2510 Atherosclerotic heart disease of native coronary artery without angina pectoris: Secondary | ICD-10-CM | POA: Diagnosis not present

## 2012-12-28 DIAGNOSIS — I10 Essential (primary) hypertension: Secondary | ICD-10-CM | POA: Diagnosis not present

## 2012-12-29 DIAGNOSIS — Z1212 Encounter for screening for malignant neoplasm of rectum: Secondary | ICD-10-CM | POA: Diagnosis not present

## 2013-04-07 ENCOUNTER — Encounter: Payer: Self-pay | Admitting: Physician Assistant

## 2013-04-07 ENCOUNTER — Ambulatory Visit (INDEPENDENT_AMBULATORY_CARE_PROVIDER_SITE_OTHER): Payer: Medicare Other | Admitting: Physician Assistant

## 2013-04-07 VITALS — BP 140/60 | HR 59 | Ht 63.0 in | Wt 117.5 lb

## 2013-04-07 DIAGNOSIS — I10 Essential (primary) hypertension: Secondary | ICD-10-CM

## 2013-04-07 DIAGNOSIS — I200 Unstable angina: Secondary | ICD-10-CM | POA: Diagnosis not present

## 2013-04-07 DIAGNOSIS — E785 Hyperlipidemia, unspecified: Secondary | ICD-10-CM | POA: Diagnosis not present

## 2013-04-07 NOTE — Patient Instructions (Signed)
1.  Follow up with Dr. Berry in 6 months. 

## 2013-04-07 NOTE — Assessment & Plan Note (Signed)
Patient had an episode of chest pain 2 days ago she reports was extremely responsive to nitroglycerin tablet she took one and resolved immediately. She's had no pain since

## 2013-04-07 NOTE — Assessment & Plan Note (Signed)
Blood pressure mildly elevated today

## 2013-04-07 NOTE — Assessment & Plan Note (Signed)
Currently treated with Lipitor.  The most recent lipid panel was 12/21/2012 total cholesterol 125, triglycerides 125, HDL 40, LDL 60

## 2013-04-07 NOTE — Progress Notes (Signed)
Date:  04/07/2013   ID:  Jo Johnson, DOB 03-06-1924, MRN [id number]  PCP:  Sheryn Bisonavid Patterson, MD  Primary Cardiologist:  Allyson SabalBerry    History of Present Illness: Jo Johnson is a 80 y.o. female Jo Johnson is a 80 y.o. female with a history of hypertension and acid reflux, who presented to [facility] on 06/21/12 with a complaint of chest pain with radiation to both upper extremities and associated dizziness. Her EKG was normal and without acute changes. Her initial troponin was negative. However, she was admitted for obsevation and for MI rule-out. The initial plan was to have the patient undergo nuclear stress testing if serial troponins were negative. However, she ruled in for ACS/NSTEMI with mildly elevated troponin levels, and the decision was made to convert from non-invasive Cardiolite stress test to cardiac catheterization. Her troponin peaked at 0.47. The procedure was performed by Dr. Herbie BaltimoreHarding, via the right radial artery. The catheterization demonstrated severe 2 vessel disease. The cultprit vessel was a 85-90% proximal stenosis of the Circumflex artery. This was successfully treated with PCI with a DES. She was also noted to have a distal OM that was 90% stenosed, along with extensive moderate to severe proximal to mid RCA disease. She had only moderate LAD disease. She had normal LV function with normal LVEDP. The plan was to treat the diffuse RCA lesions medically. She left the cath lab in stable condition and was started on dual antiplatelet therapy with ASA and Brilinta. A beta blocker and a stain were initiated. She was assessed by cardiac rehab.   Patient presents today for six-month followup. She reports one episode of nitroglycerin responsive chest pain 2 days ago. Reports yesterday she felt terrific.  He does live by herself but is checked on at least once or twice per week son and his wife.  The patient currently denies nausea, vomiting, fever, shortness of breath, orthopnea,  dizziness, PND, cough, congestion, abdominal pain, hematochezia, melena, lower extremity edema, claudication.  Wt Readings from Last 3 Encounters:  04/07/13 117 lb 8 oz (53.298 kg)  10/16/12 122 lb (55.339 kg)  07/01/12 131 lb 9.6 oz (59.693 kg)     Past Medical History  Diagnosis Date  . Hypertension   . Acid reflux   . Asthma   . Chronic cough   . Coronary artery disease     2 vessel CAD with normal LV function status post left circumflex stenting with a drug-eluting stent by Dr.  Lemmaavid Harding  . Hyperlipidemia     Current Outpatient Prescriptions  Medication Sig Dispense Refill  . amLODipine (NORVASC) 5 MG tablet Take 5 mg by mouth daily.      Marland Kitchen. aspirin EC 81 MG EC tablet Take 1 tablet (81 mg total) by mouth daily.      Marland Kitchen. atorvastatin (LIPITOR) 10 MG tablet TAKE 1 TABLET BY MOUTH DAILY AT 6 PM  30 tablet  5  . BRILINTA 90 MG TABS tablet TAKE 1 TABLET BY MOUTH TWICE DAILY  60 tablet  11  . metoprolol tartrate (LOPRESSOR) 25 MG tablet TAKE 1/2 TABLET BY MOUTH TWICE DAILY  30 tablet  5  . nitroGLYCERIN (NITROSTAT) 0.4 MG SL tablet Place 1 tablet (0.4 mg total) under the tongue every 5 (five) minutes x 3 doses as needed for chest pain.  25 tablet  2  . pantoprazole (PROTONIX) 40 MG tablet TAKE 1 TABLET BY MOUTH EVERY DAY  30 tablet  5  . valsartan (DIOVAN) 320 MG  tablet Take 320 mg by mouth daily.       No current facility-administered medications for this visit.    Allergies:   No Known Allergies  Social History:  The patient  reports that she has never smoked. Her smokeless tobacco use includes Chew. She reports that she does not drink alcohol or use illicit drugs.   Family history:  History reviewed. No pertinent family history.  ROS:  Please see the history of present illness.  All other systems reviewed and negative.   PHYSICAL EXAM: VS:  BP 140/60  Pulse 59  Ht 5\' 3"  (1.6 m)  Wt 117 lb 8 oz (53.298 kg)  BMI 20.82 kg/m2 Well nourished, well developed, in no acute  distress HEENT: Pupils are equal round react to light accommodation extraocular movements are intact.  Neck: no JVDNo cervical lymphadenopathy. Cardiac: Regular rate and rhythm without murmurs rubs or gallops. Lungs:  clear to auscultation bilaterally, no wheezing, rhonchi or rales Abd: soft, nontender, positive bowel sounds all quadrants Ext: no lower extremity edema.  2+ radial and dorsalis pedis pulses. Skin: warm and dry Neuro:  Grossly normal  EKG:  Sinus bradycardia rate 59 beats per minute nonspecific T wave changes laterally  ASSESSMENT AND PLAN:  Problem List Items Addressed This Visit   HYPERTENSION - Primary     Blood pressure mildly elevated today    Unstable angina     Patient had an episode of chest pain 2 days ago she reports was extremely responsive to nitroglycerin tablet she took one and resolved immediately. She's had no pain since    Hyperlipidemia     Currently treated with Lipitor.  The most recent lipid panel was 12/21/2012 total cholesterol 125, triglycerides 125, HDL 40, LDL 60       Patient has had some weight loss since her last office visit. Recommend adding boost drink once per day 2 her diet

## 2013-05-29 ENCOUNTER — Other Ambulatory Visit (HOSPITAL_COMMUNITY): Payer: Self-pay | Admitting: Cardiology

## 2013-05-31 DIAGNOSIS — I10 Essential (primary) hypertension: Secondary | ICD-10-CM | POA: Diagnosis not present

## 2013-05-31 DIAGNOSIS — L0291 Cutaneous abscess, unspecified: Secondary | ICD-10-CM | POA: Diagnosis not present

## 2013-05-31 DIAGNOSIS — L0390 Cellulitis, unspecified: Secondary | ICD-10-CM | POA: Diagnosis not present

## 2013-05-31 DIAGNOSIS — R7309 Other abnormal glucose: Secondary | ICD-10-CM | POA: Diagnosis not present

## 2013-05-31 DIAGNOSIS — I2510 Atherosclerotic heart disease of native coronary artery without angina pectoris: Secondary | ICD-10-CM | POA: Diagnosis not present

## 2013-05-31 DIAGNOSIS — IMO0002 Reserved for concepts with insufficient information to code with codable children: Secondary | ICD-10-CM | POA: Diagnosis not present

## 2013-05-31 DIAGNOSIS — R634 Abnormal weight loss: Secondary | ICD-10-CM | POA: Diagnosis not present

## 2013-05-31 NOTE — Telephone Encounter (Signed)
Rx was sent to pharmacy electronically. 

## 2013-06-07 ENCOUNTER — Other Ambulatory Visit: Payer: Self-pay | Admitting: *Deleted

## 2013-06-07 MED ORDER — PANTOPRAZOLE SODIUM 40 MG PO TBEC: DELAYED_RELEASE_TABLET | ORAL | Status: DC

## 2013-06-07 NOTE — Telephone Encounter (Signed)
Rx was sent to pharmacy electronically. 

## 2013-06-08 ENCOUNTER — Other Ambulatory Visit: Payer: Self-pay | Admitting: *Deleted

## 2013-08-14 ENCOUNTER — Other Ambulatory Visit (HOSPITAL_COMMUNITY): Payer: Self-pay | Admitting: Cardiology

## 2013-08-17 NOTE — Telephone Encounter (Signed)
Refilled #[id number] with 9 refills on 05/31/13 Refill requested too soon

## 2013-08-19 ENCOUNTER — Telehealth: Payer: Self-pay | Admitting: Cardiovascular Disease

## 2013-08-19 NOTE — Telephone Encounter (Signed)
Spoke with pharmacy staff. There are refills on file. Notified patient's son.

## 2013-08-19 NOTE — Telephone Encounter (Signed)
Calling because the pharmacy states that we need to send a new Prescription for her Brilinta 90mg   Take one tablet by mouth twice daily .Marland Kitchen. [facility]... [facility] on [address] .Marland Kitchen. Please call if you have any questions   Thanks

## 2013-10-07 DIAGNOSIS — IMO0002 Reserved for concepts with insufficient information to code with codable children: Secondary | ICD-10-CM | POA: Diagnosis not present

## 2013-10-07 DIAGNOSIS — R634 Abnormal weight loss: Secondary | ICD-10-CM | POA: Diagnosis not present

## 2013-10-07 DIAGNOSIS — Z23 Encounter for immunization: Secondary | ICD-10-CM | POA: Diagnosis not present

## 2013-10-07 DIAGNOSIS — R7309 Other abnormal glucose: Secondary | ICD-10-CM | POA: Diagnosis not present

## 2013-10-07 DIAGNOSIS — J45909 Unspecified asthma, uncomplicated: Secondary | ICD-10-CM | POA: Diagnosis not present

## 2013-10-07 DIAGNOSIS — I2510 Atherosclerotic heart disease of native coronary artery without angina pectoris: Secondary | ICD-10-CM | POA: Diagnosis not present

## 2013-10-07 DIAGNOSIS — R269 Unspecified abnormalities of gait and mobility: Secondary | ICD-10-CM | POA: Diagnosis not present

## 2013-10-19 ENCOUNTER — Ambulatory Visit (INDEPENDENT_AMBULATORY_CARE_PROVIDER_SITE_OTHER): Payer: Medicare Other | Admitting: Cardiovascular Disease

## 2013-10-19 ENCOUNTER — Encounter: Payer: Self-pay | Admitting: Cardiovascular Disease

## 2013-10-19 VITALS — BP 137/63 | HR 57 | Ht 64.0 in | Wt 125.0 lb

## 2013-10-19 DIAGNOSIS — I2510 Atherosclerotic heart disease of native coronary artery without angina pectoris: Secondary | ICD-10-CM | POA: Diagnosis not present

## 2013-10-19 DIAGNOSIS — I25119 Atherosclerotic heart disease of native coronary artery with unspecified angina pectoris: Secondary | ICD-10-CM

## 2013-10-19 DIAGNOSIS — I10 Essential (primary) hypertension: Secondary | ICD-10-CM

## 2013-10-19 DIAGNOSIS — I200 Unstable angina: Secondary | ICD-10-CM | POA: Diagnosis not present

## 2013-10-19 DIAGNOSIS — I209 Angina pectoris, unspecified: Secondary | ICD-10-CM | POA: Diagnosis not present

## 2013-10-19 DIAGNOSIS — E785 Hyperlipidemia, unspecified: Secondary | ICD-10-CM

## 2013-10-19 NOTE — Patient Instructions (Signed)
We request that you follow-up in: 6 months with an extender and in 1 year with Dr Berry  You will receive a reminder letter in the mail two months in advance. If you don't receive a letter, please call our office to schedule the follow-up appointment.   

## 2013-10-19 NOTE — Progress Notes (Signed)
10/19/2013 Jo Shadoweggy C Paul   15-Aug-1924  [id number]  Primary Physician Sheryn Bisonavid Patterson, MD Primary Cardiologist: Runell GessJonathan J. Jacquel Redditt MD Roseanne RenoFACP,FACC,FAHA, FSCAI   HPI:  Jo Johnson is a [age]y.o. female with a history of hypertension and acid reflux, who [facility] with a complaint of chest pain with radiation to both upper extremities and associated dizziness. Her EKG was normal and without acute changes. Her initial troponin was negative. However, she was admitted for obsevation and for MI rule-out. The initial plan was to have the patient undergo nuclear stress testing if serial troponins were negative. However, she ruled in for ACS/NSTEMI with mildly elevated troponin levels, and the decision was made to convert from non-invasive Cardiolite stress test to cardiac catheterization. Her troponin peaked at 0.47. The procedure was performed by Dr. Herbie BaltimoreHarding, via the right radial artery. The catheterization demonstrated severe 2 vessel disease. The cultprit vessel was a 85-90% proximal stenosis of the Circumflex artery. This was successfully treated with PCI with a DES. She was also noted to have a distal OM that was 90% stenosed, along with extensive moderate to severe proximal to mid RCA disease. She had only moderate LAD disease. She had normal LV function with normal LVEDP. The plan was to treat the diffuse RCA lesions medically. She left the cath lab in stable condition and was started on dual antiplatelet therapy with ASA and Brilinta. A beta blocker and a stain were initiated. She was assessed by cardiac rehab.  She presents today for followup to her hospitalization and MI. According to her son, who has been staying with her, she does have some chronic shortness of breath which has had for years but has had not had any worsening symptoms. She and her son also reported that she had some chest discomfort with left arm pain which he noticed after waking up and may nap when she was lying on  her side on the couch. The pain resolved after moving her arm and adjusting her body position. She's also gets tired while doing the clothes washing.  She saw Huey BienenstockBrian Hager [facility] in the office on 04/07/13. Since at times she's only required sublingual nitroglycerin twice and remained stable.    Current Outpatient Prescriptions  Medication Sig Dispense Refill  . amLODipine (NORVASC) 5 MG tablet Take 5 mg by mouth daily.      Marland Kitchen. aspirin EC 81 MG EC tablet Take 1 tablet (81 mg total) by mouth daily.      Marland Kitchen. atorvastatin (LIPITOR) 10 MG tablet TAKE 1 TABLET BY MOUTH DAILY AT 6 PM  30 tablet  5  . BRILINTA 90 MG TABS tablet TAKE 1 TABLET BY MOUTH TWICE DAILY  60 tablet  11  . BRILINTA 90 MG TABS tablet TAKE 1 TABLET BY MOUTH TWICE DAILY  60 tablet  9  . losartan (COZAAR) 50 MG tablet Take 50 mg by mouth daily.       . metoprolol tartrate (LOPRESSOR) 25 MG tablet TAKE 1/2 TABLET BY MOUTH TWICE DAILY  30 tablet  5  . nitroGLYCERIN (NITROSTAT) 0.4 MG SL tablet Place 1 tablet (0.4 mg total) under the tongue every 5 (five) minutes x 3 doses as needed for chest pain.  25 tablet  2  . pantoprazole (PROTONIX) 40 MG tablet TAKE 1 TABLET BY MOUTH EVERY DAY  30 tablet  9   No current facility-administered medications for this visit.    No Known Allergies  History   Social History  .  Marital Status: Divorced    Spouse Name: N/A    Number of Children: N/A  . Years of Education: N/A   Occupational History  . Not on file.   Social History Main Topics  . Smoking status: Never Smoker   . Smokeless tobacco: Current User    Types: Chew  . Alcohol Use: No  . Drug Use: No  . Sexual Activity: No   Other Topics Concern  . Not on file   Social History Narrative  . No narrative on file     Review of Systems: General: negative for chills, fever, night sweats or weight changes.  Cardiovascular: negative for chest pain, dyspnea on exertion, edema, orthopnea, palpitations, paroxysmal nocturnal dyspnea or  shortness of breath Dermatological: negative for rash Respiratory: negative for cough or wheezing Urologic: negative for hematuria Abdominal: negative for nausea, vomiting, diarrhea, bright red blood per rectum, melena, or hematemesis Neurologic: negative for visual changes, syncope, or dizziness All other systems reviewed and are otherwise negative except as noted above.    Blood pressure 137/63, pulse 57, height 5\' 4"  (1.626 m), weight 125 lb (56.7 kg).  General appearance: alert and no distress Neck: no adenopathy, no carotid bruit, no JVD, supple, symmetrical, trachea midline and thyroid not enlarged, symmetric, no tenderness/mass/nodules Lungs: clear to auscultation bilaterally Heart: regular rate and rhythm, S1, S2 normal, no murmur, click, rub or gallop Extremities: extremities normal, atraumatic, no cyanosis or edema  EKG sinus bradycardia of 57 without ST or T wave changes  ASSESSMENT AND PLAN:   HYPERTENSION Controlled on current medications  Hyperlipidemia On statin therapy followed by her PCP  CAD (coronary artery disease) History of CAD status post non-STEMI June 2014 catheterization performed by Dr. Herbie Baltimore the right radial approach demonstrating two-vessel disease. He stented the circumflex with a drug-eluting stent. She did have moderate to severe diffuse RCA disease which he treated medically. She had normal LV function. She's had only 2 episodes of nitroglycerin responsive chest pain in the last 6 months and otherwise remained stable on dual antiplatelet therapy.      Runell Gess MD FACP,FACC,FAHA, [facility] 10/19/2013 10:48 AM

## 2013-10-19 NOTE — Assessment & Plan Note (Signed)
History of CAD status post non-STEMI June 2014 catheterization performed by Dr. Herbie BaltimoreHarding the right radial approach demonstrating two-vessel disease. He stented the circumflex with a drug-eluting stent. She did have moderate to severe diffuse RCA disease which he treated medically. She had normal LV function. She's had only 2 episodes of nitroglycerin responsive chest pain in the last 6 months and otherwise remained stable on dual antiplatelet therapy.

## 2013-10-19 NOTE — Assessment & Plan Note (Signed)
On statin therapy followed by her PCP 

## 2013-10-19 NOTE — Assessment & Plan Note (Signed)
Controlled on current medications 

## 2013-10-20 DIAGNOSIS — H3530 Unspecified macular degeneration: Secondary | ICD-10-CM | POA: Diagnosis not present

## 2013-10-20 DIAGNOSIS — Z961 Presence of intraocular lens: Secondary | ICD-10-CM | POA: Diagnosis not present

## 2013-10-20 DIAGNOSIS — H18599 Other hereditary corneal dystrophies, unspecified eye: Secondary | ICD-10-CM | POA: Diagnosis not present

## 2013-12-18 ENCOUNTER — Other Ambulatory Visit: Payer: Self-pay | Admitting: Cardiovascular Disease

## 2013-12-20 ENCOUNTER — Other Ambulatory Visit: Payer: Self-pay

## 2013-12-20 NOTE — Telephone Encounter (Signed)
Rx was sent to pharmacy electronically. 

## 2013-12-30 ENCOUNTER — Encounter (HOSPITAL_COMMUNITY): Payer: Self-pay | Admitting: Cardiology

## 2014-03-16 ENCOUNTER — Telehealth: Payer: Self-pay | Admitting: Cardiovascular Disease

## 2014-03-16 MED ORDER — NITROGLYCERIN 0.4 MG SL SUBL
0.4000 mg | SUBLINGUAL_TABLET | SUBLINGUAL | Status: AC | PRN
Start: 2014-03-16 — End: ?

## 2014-03-16 NOTE — Telephone Encounter (Signed)
°  1. Which medications need to be refilled? NTG  2. Which pharmacy is medication to be sent to?[facility] on [address]  3. Do they need a 30 day or 90 day supply? prn  4. Would they like a call back once the medication has been sent to the pharmacy? Yes, she has one left

## 2014-03-16 NOTE — Telephone Encounter (Signed)
No recent CP, just needed available.  Refill submitted to patient's preferred pharmacy. Informed patient. Pt voiced understanding, no other stated concerns at this time.

## 2014-04-01 ENCOUNTER — Other Ambulatory Visit: Payer: Self-pay | Admitting: Cardiovascular Disease

## 2014-04-01 NOTE — Telephone Encounter (Signed)
Rx has been sent to the pharmacy electronically. ° °

## 2014-04-18 ENCOUNTER — Encounter: Payer: Self-pay | Admitting: Cardiology

## 2014-04-18 ENCOUNTER — Ambulatory Visit (INDEPENDENT_AMBULATORY_CARE_PROVIDER_SITE_OTHER): Payer: Medicare Other | Admitting: Cardiology

## 2014-04-18 VITALS — BP 110/60 | HR 78 | Ht 63.0 in | Wt 125.4 lb

## 2014-04-18 DIAGNOSIS — I214 Non-ST elevation (NSTEMI) myocardial infarction: Secondary | ICD-10-CM | POA: Diagnosis not present

## 2014-04-18 DIAGNOSIS — I10 Essential (primary) hypertension: Secondary | ICD-10-CM

## 2014-04-18 DIAGNOSIS — E785 Hyperlipidemia, unspecified: Secondary | ICD-10-CM

## 2014-04-18 DIAGNOSIS — I2510 Atherosclerotic heart disease of native coronary artery without angina pectoris: Secondary | ICD-10-CM

## 2014-04-18 NOTE — Progress Notes (Signed)
Cardiology Office Note   Date:  04/18/2014   ID:  Jo Johnson, DOB [birth year], MRN [id number]  PCP:  Sheryn Bison, MD  Cardiologist:  Dr. Allyson Sabal    Chief Complaint  Patient presents with  . Coronary Artery Disease    6 months. dyspnea possibly due to anexity.       History of Present Illness: Jo Johnson is a 81 y.o. female who presents for 6 moth eval of her BP and CAD.  She had NSTEMI with troponin 0.47 in June 2014.  She had 2 vessel disease and had stent - DES to LCX.   Also with distal OM that was 90% stenosed, along with extensive moderate to severe proximal to mid RCA disease. She had only moderate LAD disease. She had normal LV function with normal LVEDP. The plan was to treat the diffuse RCA lesions medically.  Today she had one episode of chest pain, no NTG needed, may be due to her mattress, pain only when she went to bed.  Resolved easily.  Some SOB but has hx of SOB and now pollen has irritated her.  No exercise except when walking in the house. Chol followed by PCP, we have asked for most recent.  She does admitting to eating healthy.    Past Medical History  Diagnosis Date  . Hypertension   . Acid reflux   . Asthma   . Chronic cough   . Coronary artery disease     2 vessel CAD with normal LV function status post left circumflex stenting with a drug-eluting stent by Dr. Bryan Lemma  . Hyperlipidemia     Past Surgical History  Procedure Laterality Date  . Appendectomy    . Cholecystectomy    . Surgery to legs d/t trauma    . Eye surgery    . Left heart catheterization with coronary angiogram N/A 06/22/2012    Procedure: LEFT HEART CATHETERIZATION WITH CORONARY ANGIOGRAM;  Surgeon: Marykay Lex, MD;  Location: [facility] CATH LAB;  Service: Cardiovascular;  Laterality: N/A;  . Percutaneous coronary stent intervention (pci-s)  06/22/2012    Procedure: PERCUTANEOUS CORONARY STENT INTERVENTION (PCI-S);  Surgeon: Marykay Lex, MD;  Location: [facility] CATH LAB;   Service: Cardiovascular;;     Current Outpatient Prescriptions  Medication Sig Dispense Refill  . amLODipine (NORVASC) 5 MG tablet Take 5 mg by mouth daily.    Marland Kitchen aspirin EC 81 MG EC tablet Take 1 tablet (81 mg total) by mouth daily.    Marland Kitchen atorvastatin (LIPITOR) 10 MG tablet TAKE 1 TABLET BY MOUTH DAILY AT 6 PM 30 tablet 5  . BRILINTA 90 MG TABS tablet TAKE 1 TABLET BY MOUTH TWICE DAILY 60 tablet 11  . losartan (COZAAR) 50 MG tablet Take 50 mg by mouth daily.     . metoprolol tartrate (LOPRESSOR) 25 MG tablet TAKE 1/2 TABLET BY MOUTH TWICE DAILY 30 tablet 5  . nitroGLYCERIN (NITROSTAT) 0.4 MG SL tablet Place 1 tablet (0.4 mg total) under the tongue every 5 (five) minutes x 3 doses as needed for chest pain. 25 tablet 2  . pantoprazole (PROTONIX) 40 MG tablet TAKE 1 TABLET BY MOUTH DAILY 30 tablet 6   No current facility-administered medications for this visit.    Allergies:   Review of patient's allergies indicates no known allergies.    Social History:  The patient  reports that she has never smoked. Her smokeless tobacco use includes Chew. She reports that she does not drink  alcohol or use illicit drugs.   Family History:  The patient's  family history includes COPD in her brother; CVA in her mother; Cancer in her father; Dementia in her brother.    ROS:  General:mild allergy symptoms no fevers, no weight changes from Sept. Skin:rashes on face chronic no ulcers HEENT:no blurred vision, no congestion CV:see HPI PUL:see HPI GI:no diarrhea constipation or melena, no indigestion GU:no hematuria, no dysuria MS:no joint pain, no claudication Neuro:no syncope, no lightheadedness Endo:no diabetes, no thyroid disease  Wt Readings from Last 3 Encounters:  04/18/14 125 lb 6.4 oz (56.881 kg)  10/19/13 125 lb (56.7 kg)  04/07/13 117 lb 8 oz (53.298 kg)     PHYSICAL EXAM: VS:  BP 110/60 mmHg  Pulse 78  Ht 5\' 3"  (1.6 m)  Wt 125 lb 6.4 oz (56.881 kg)  BMI 22.22 kg/m2 , BMI Body mass  index is 22.22 kg/(m^2). General:Pleasant affect, NAD Skin:Warm and dry, brisk capillary refill HEENT:normocephalic, sclera clear, mucus membranes moist Neck:supple, no JVD, no bruits  Heart:S1S2 RRR without murmur, gallup, rub or click Lungs:clear without rales, rhonchi, or wheezes ZOX:WRUEAbd:soft, non tender, + BS, do not palpate liver spleen or masses Ext:no lower ext edema, 2+ pedal pulses, 2+ radial pulses Neuro:alert and oriented, MAE, follows commands, + facial symmetry    EKG:  EKG is ordered today. The ekg ordered today demonstrates SR with HR 78 no acute changes from previous.     Recent Labs: No results found for requested labs within last 365 days.    Lipid Panel    Component Value Date/Time   CHOL 153 06/22/2012 0520   TRIG 100 06/22/2012 0520   HDL 45 06/22/2012 0520   CHOLHDL 3.4 06/22/2012 0520   VLDL 20 06/22/2012 0520   LDLCALC 88 06/22/2012 0520       Other studies Reviewed: Additional studies/ records that were reviewed today include: previous visit.   ASSESSMENT AND PLAN: HYPERTENSION Controlled on current medications  Hyperlipidemia  On statin therapy followed by her PCP  CAD (coronary artery disease) History of CAD status post non-STEMI June 2014 catheterization performed by Dr. Herbie BaltimoreHarding demonstrating two-vessel disease. He stented the circumflex with a drug-eluting stent. She did have moderate to severe diffuse RCA disease which he treated medically. She had normal LV function. remains stable on dual antiplatelet therapy. One episode of chest pain, no need for NTG resolved without treatment.   Follow up with Dr. Erlene QuanJ. Berry in 6 months.     Current medicines are reviewed with the patient today.  The patient Has no concerns regarding medicines.  The following changes have been made:  See above Labs/ tests ordered today include:see above  Disposition:   FU:  see above  Nyoka LintSigned, Kenlee Vogt R, NP  04/18/2014 10:31 PM    [facility] [address], [place], [place]  [address]/ [address] [place], [place] Phone: [phone]; Fax: [phone]  [phone]

## 2014-04-18 NOTE — Patient Instructions (Signed)
No change with current medications  Keep exercising  Your physician wants you to follow-up in 6 months Dr Allyson SabalBERRY.  You will receive a reminder letter in the mail two months in advance. If you don't receive a letter, please call our office to schedule the follow-up appointment.

## 2014-04-28 DIAGNOSIS — N390 Urinary tract infection, site not specified: Secondary | ICD-10-CM | POA: Diagnosis not present

## 2014-04-28 DIAGNOSIS — E785 Hyperlipidemia, unspecified: Secondary | ICD-10-CM | POA: Diagnosis not present

## 2014-04-28 DIAGNOSIS — R7301 Impaired fasting glucose: Secondary | ICD-10-CM | POA: Diagnosis not present

## 2014-04-28 DIAGNOSIS — R8299 Other abnormal findings in urine: Secondary | ICD-10-CM | POA: Diagnosis not present

## 2014-04-28 DIAGNOSIS — I10 Essential (primary) hypertension: Secondary | ICD-10-CM | POA: Diagnosis not present

## 2014-05-05 DIAGNOSIS — Z6822 Body mass index (BMI) 22.0-22.9, adult: Secondary | ICD-10-CM | POA: Diagnosis not present

## 2014-05-05 DIAGNOSIS — L988 Other specified disorders of the skin and subcutaneous tissue: Secondary | ICD-10-CM | POA: Diagnosis not present

## 2014-05-05 DIAGNOSIS — I2510 Atherosclerotic heart disease of native coronary artery without angina pectoris: Secondary | ICD-10-CM | POA: Diagnosis not present

## 2014-05-05 DIAGNOSIS — Z0000 Encounter for general adult medical examination without abnormal findings: Secondary | ICD-10-CM | POA: Diagnosis not present

## 2014-05-05 DIAGNOSIS — I10 Essential (primary) hypertension: Secondary | ICD-10-CM | POA: Diagnosis not present

## 2014-05-05 DIAGNOSIS — E785 Hyperlipidemia, unspecified: Secondary | ICD-10-CM | POA: Diagnosis not present

## 2014-05-05 DIAGNOSIS — R269 Unspecified abnormalities of gait and mobility: Secondary | ICD-10-CM | POA: Diagnosis not present

## 2014-05-05 DIAGNOSIS — J45909 Unspecified asthma, uncomplicated: Secondary | ICD-10-CM | POA: Diagnosis not present

## 2014-05-05 DIAGNOSIS — Z1389 Encounter for screening for other disorder: Secondary | ICD-10-CM | POA: Diagnosis not present

## 2014-05-05 DIAGNOSIS — R7301 Impaired fasting glucose: Secondary | ICD-10-CM | POA: Diagnosis not present

## 2014-05-05 DIAGNOSIS — K219 Gastro-esophageal reflux disease without esophagitis: Secondary | ICD-10-CM | POA: Diagnosis not present

## 2014-05-05 DIAGNOSIS — M545 Low back pain: Secondary | ICD-10-CM | POA: Diagnosis not present

## 2014-05-09 DIAGNOSIS — Z1212 Encounter for screening for malignant neoplasm of rectum: Secondary | ICD-10-CM | POA: Diagnosis not present

## 2014-05-27 ENCOUNTER — Other Ambulatory Visit: Payer: Self-pay | Admitting: Cardiovascular Disease

## 2014-05-27 NOTE — Telephone Encounter (Signed)
Rx(s) sent to pharmacy electronically.  

## 2014-07-21 DIAGNOSIS — L738 Other specified follicular disorders: Secondary | ICD-10-CM | POA: Diagnosis not present

## 2014-07-21 DIAGNOSIS — L718 Other rosacea: Secondary | ICD-10-CM | POA: Diagnosis not present

## 2014-07-21 DIAGNOSIS — L821 Other seborrheic keratosis: Secondary | ICD-10-CM | POA: Diagnosis not present

## 2014-09-20 ENCOUNTER — Other Ambulatory Visit: Payer: Self-pay | Admitting: Cardiovascular Disease

## 2014-09-20 NOTE — Telephone Encounter (Signed)
Rx request sent to pharmacy.  

## 2014-10-06 ENCOUNTER — Other Ambulatory Visit: Payer: Self-pay | Admitting: Cardiovascular Disease

## 2014-10-28 ENCOUNTER — Ambulatory Visit (INDEPENDENT_AMBULATORY_CARE_PROVIDER_SITE_OTHER): Payer: Medicare Other | Admitting: Cardiovascular Disease

## 2014-10-28 ENCOUNTER — Encounter: Payer: Self-pay | Admitting: Cardiovascular Disease

## 2014-10-28 VITALS — BP 132/70 | HR 70 | Ht 63.0 in | Wt 124.0 lb

## 2014-10-28 DIAGNOSIS — I2510 Atherosclerotic heart disease of native coronary artery without angina pectoris: Secondary | ICD-10-CM | POA: Diagnosis not present

## 2014-10-28 DIAGNOSIS — I10 Essential (primary) hypertension: Secondary | ICD-10-CM | POA: Diagnosis not present

## 2014-10-28 DIAGNOSIS — I214 Non-ST elevation (NSTEMI) myocardial infarction: Secondary | ICD-10-CM

## 2014-10-28 DIAGNOSIS — E785 Hyperlipidemia, unspecified: Secondary | ICD-10-CM

## 2014-10-28 NOTE — Progress Notes (Signed)
10/28/2014 SHEYENNE KONZ   03/10/1924  [id number]  Primary Physician Sheryn Bison, MD Primary Cardiologist: Runell Gess MD Roseanne Reno   HPI:  Jo Johnson is a 72.o. female with a history of hypertension and acid reflux, who presented to [facility] on 06/21/12 with a complaint of chest pain with radiation to both upper extremities and associated dizziness. Her EKG was normal and without acute changes. Her initial troponin was negative. However, she was admitted for obsevation and for MI rule-out. The initial plan was to have the patient undergo nuclear stress testing if serial troponins were negative. However, she ruled in for ACS/NSTEMI with mildly elevated troponin levels, and the decision was made to convert from non-invasive Cardiolite stress test to cardiac catheterization. Her troponin peaked at 0.47. The procedure was performed by Dr. Herbie Baltimore, via the right radial artery. The catheterization demonstrated severe 2 vessel disease. The cultprit vessel was a 85-90% proximal stenosis of the Circumflex artery. This was successfully treated with PCI with a DES. She was also noted to have a distal OM that was 90% stenosed, along with extensive moderate to severe proximal to mid RCA disease. She had only moderate LAD disease. She had normal LV function with normal LVEDP. The plan was to treat the diffuse RCA lesions medically. She left the cath lab in stable condition and was started on dual antiplatelet therapy with ASA and Brilinta. A beta blocker and a stain were initiated. She was assessed by cardiac rehab.  She presents today for followup to her hospitalization and MI. According to her son, who has been staying with her, she does have some chronic shortness of breath which has had for years but has had not had any worsening symptoms. She and her son also reported that she had some chest discomfort with left arm pain which he noticed after waking up and may nap when she was lying on  her side on the couch. The pain resolved after moving her arm and adjusting her body position. She's also gets tired while doing the clothes washing.  Since I saw her in the office one year ago she's medically stable and is only had to take sublingual nitroglycerin 3 times.   Current Outpatient Prescriptions  Medication Sig Dispense Refill  . amLODipine (NORVASC) 5 MG tablet Take 5 mg by mouth daily.    Marland Kitchen aspirin EC 81 MG EC tablet Take 1 tablet (81 mg total) by mouth daily.    Marland Kitchen atorvastatin (LIPITOR) 10 MG tablet TAKE 1 TABLET BY MOUTH DAILY AT 6 PM 30 tablet 5  . BRILINTA 90 MG TABS tablet TAKE 1 TABLET BY MOUTH TWICE DAILY 60 tablet 0  . losartan (COZAAR) 50 MG tablet Take 50 mg by mouth daily.     . metoprolol tartrate (LOPRESSOR) 25 MG tablet TAKE 1/2 TABLET BY MOUTH TWICE DAILY 90 tablet 0  . nitroGLYCERIN (NITROSTAT) 0.4 MG SL tablet Place 1 tablet (0.4 mg total) under the tongue every 5 (five) minutes x 3 doses as needed for chest pain. 25 tablet 2  . pantoprazole (PROTONIX) 40 MG tablet TAKE 1 TABLET BY MOUTH DAILY 30 tablet 6   No current facility-administered medications for this visit.    No Known Allergies  Social History   Social History  . Marital Status: Divorced    Spouse Name: N/A  . Number of Children: N/A  . Years of Education: N/A   Occupational History  . Not on file.   Social  History Main Topics  . Smoking status: Never Smoker   . Smokeless tobacco: Current User    Types: Chew  . Alcohol Use: No  . Drug Use: No  . Sexual Activity: No   Other Topics Concern  . Not on file   Social History Narrative     Review of Systems: General: negative for chills, fever, night sweats or weight changes.  Cardiovascular: negative for chest pain, dyspnea on exertion, edema, orthopnea, palpitations, paroxysmal nocturnal dyspnea or shortness of breath Dermatological: negative for rash Respiratory: negative for cough or wheezing Urologic: negative for  hematuria Abdominal: negative for nausea, vomiting, diarrhea, bright red blood per rectum, melena, or hematemesis Neurologic: negative for visual changes, syncope, or dizziness All other systems reviewed and are otherwise negative except as noted above.    Blood pressure 132/70, pulse 70, height  (1.6 m), weight 124 lb (56.246 kg).  General appearance: alert and no distress Neck: no adenopathy, no carotid bruit, no JVD, supple, symmetrical, trachea midline and thyroid not enlarged, symmetric, no tenderness/mass/nodules Lungs: clear to auscultation bilaterally Heart: regular rate and rhythm, S1, S2 normal, no murmur, click, rub or gallop Extremities: extremities normal, atraumatic, no cyanosis or edema  EKG normal sinus rhythm at 70 without ST or T-wave changes. I personally reviewed this EKG  ASSESSMENT AND PLAN:   NSTEMI - S/P PCI + DES to Prox Circumflex History of CAD status post non-STEMI with radial cath performed by Dr. Herbie Baltimore revealing 2 vessel disease. Her culprit was a 90% circumflex stenosis which was stented with a DES. She had distal OM stenosis as well as moderate to severe proximal mid RCA disease. She did have moderate LAD disease as well as normal LV function and the plan was to treat the diffuse RCA disease medically. She's only had to take supplements glycerin 3 times over the last year and otherwise was remains critically stable.  Hyperlipidemia History of hyperlipidemia on atorvastatin 10 mg a day followed by her PCP  Essential hypertension History of hypertension blood pressure measured at 132/70. She is on amlodipine, losartan and Lopressor. Continue current meds at current dosing      Runell Gess MD [facility], [facility] 10/28/2014 12:05 PM

## 2014-10-28 NOTE — Assessment & Plan Note (Signed)
History of CAD status post non-STEMI with radial cath performed by Dr. Herbie Baltimore revealing 2 vessel disease. Her culprit was a 90% circumflex stenosis which was stented with a DES. She had distal OM stenosis as well as moderate to severe proximal mid RCA disease. She did have moderate LAD disease as well as normal LV function and the plan was to treat the diffuse RCA disease medically. She's only had to take supplements glycerin 3 times over the last year and otherwise was remains critically stable.

## 2014-10-28 NOTE — Patient Instructions (Signed)
Medication Instructions:  Your physician recommends that you continue on your current medications as directed. Please refer to the Current Medication list given to you today.  Labwork: I will get your lab work from your Primary Care Physician.   Testing/Procedures: none  Follow-Up: Your physician wants you to follow-up in: 12 months with Dr. Berry. You will receive a reminder letter in the mail two months in advance. If you don't receive a letter, please call our office to schedule the follow-up appointment.   Any Other Special Instructions Will Be Listed Below (If Applicable).   

## 2014-10-28 NOTE — Assessment & Plan Note (Signed)
History of hyperlipidemia on atorvastatin 10 mg a day followed by her PCP 

## 2014-10-28 NOTE — Assessment & Plan Note (Signed)
History of hypertension blood pressure measured at 132/70. She is on amlodipine, losartan and Lopressor. Continue current meds at current dosing

## 2014-11-11 ENCOUNTER — Other Ambulatory Visit: Payer: Self-pay | Admitting: Cardiovascular Disease

## 2014-11-12 ENCOUNTER — Other Ambulatory Visit: Payer: Self-pay | Admitting: Cardiovascular Disease

## 2014-12-17 ENCOUNTER — Other Ambulatory Visit: Payer: Self-pay | Admitting: Cardiovascular Disease

## 2015-03-31 ENCOUNTER — Telehealth: Payer: Self-pay | Admitting: Cardiovascular Disease

## 2015-03-31 DIAGNOSIS — Z6821 Body mass index (BMI) 21.0-21.9, adult: Secondary | ICD-10-CM | POA: Diagnosis not present

## 2015-03-31 DIAGNOSIS — J111 Influenza due to unidentified influenza virus with other respiratory manifestations: Secondary | ICD-10-CM | POA: Diagnosis not present

## 2015-03-31 DIAGNOSIS — R05 Cough: Secondary | ICD-10-CM | POA: Diagnosis not present

## 2015-03-31 DIAGNOSIS — J45909 Unspecified asthma, uncomplicated: Secondary | ICD-10-CM | POA: Diagnosis not present

## 2015-03-31 NOTE — Telephone Encounter (Signed)
Spoke with pt husband,he is taking her to her PCP today. He is going to make sure to take her medications to that appt so they will be able to prescribe something for her symptoms. Told the pt they could call with any questions.

## 2015-03-31 NOTE — Telephone Encounter (Signed)
Pt's husband called in stating that she has the flu and with all the cardiac medication she is on, he would like to know what is safe for her to take. Please f/u with him  Thanks

## 2015-05-03 DIAGNOSIS — R7301 Impaired fasting glucose: Secondary | ICD-10-CM | POA: Diagnosis not present

## 2015-05-03 DIAGNOSIS — I10 Essential (primary) hypertension: Secondary | ICD-10-CM | POA: Diagnosis not present

## 2015-05-03 DIAGNOSIS — E784 Other hyperlipidemia: Secondary | ICD-10-CM | POA: Diagnosis not present

## 2015-05-09 DIAGNOSIS — E784 Other hyperlipidemia: Secondary | ICD-10-CM | POA: Diagnosis not present

## 2015-05-09 DIAGNOSIS — N390 Urinary tract infection, site not specified: Secondary | ICD-10-CM | POA: Diagnosis not present

## 2015-05-09 DIAGNOSIS — H9193 Unspecified hearing loss, bilateral: Secondary | ICD-10-CM | POA: Diagnosis not present

## 2015-05-09 DIAGNOSIS — M545 Low back pain: Secondary | ICD-10-CM | POA: Diagnosis not present

## 2015-05-09 DIAGNOSIS — I2510 Atherosclerotic heart disease of native coronary artery without angina pectoris: Secondary | ICD-10-CM | POA: Diagnosis not present

## 2015-05-09 DIAGNOSIS — Z1389 Encounter for screening for other disorder: Secondary | ICD-10-CM | POA: Diagnosis not present

## 2015-05-09 DIAGNOSIS — Z6821 Body mass index (BMI) 21.0-21.9, adult: Secondary | ICD-10-CM | POA: Diagnosis not present

## 2015-05-09 DIAGNOSIS — Z0000 Encounter for general adult medical examination without abnormal findings: Secondary | ICD-10-CM | POA: Diagnosis not present

## 2015-05-09 DIAGNOSIS — R2689 Other abnormalities of gait and mobility: Secondary | ICD-10-CM | POA: Diagnosis not present

## 2015-05-09 DIAGNOSIS — R7301 Impaired fasting glucose: Secondary | ICD-10-CM | POA: Diagnosis not present

## 2015-05-09 DIAGNOSIS — J45998 Other asthma: Secondary | ICD-10-CM | POA: Diagnosis not present

## 2015-05-09 DIAGNOSIS — E46 Unspecified protein-calorie malnutrition: Secondary | ICD-10-CM | POA: Diagnosis not present

## 2015-05-09 DIAGNOSIS — Z23 Encounter for immunization: Secondary | ICD-10-CM | POA: Diagnosis not present

## 2015-05-09 DIAGNOSIS — I10 Essential (primary) hypertension: Secondary | ICD-10-CM | POA: Diagnosis not present

## 2015-05-09 DIAGNOSIS — R8299 Other abnormal findings in urine: Secondary | ICD-10-CM | POA: Diagnosis not present

## 2015-05-19 DIAGNOSIS — L270 Generalized skin eruption due to drugs and medicaments taken internally: Secondary | ICD-10-CM | POA: Diagnosis not present

## 2015-05-19 DIAGNOSIS — M5380 Other specified dorsopathies, site unspecified: Secondary | ICD-10-CM | POA: Diagnosis not present

## 2015-05-19 DIAGNOSIS — R229 Localized swelling, mass and lump, unspecified: Secondary | ICD-10-CM | POA: Diagnosis not present

## 2015-05-19 DIAGNOSIS — I10 Essential (primary) hypertension: Secondary | ICD-10-CM | POA: Diagnosis not present

## 2015-05-19 DIAGNOSIS — Z6821 Body mass index (BMI) 21.0-21.9, adult: Secondary | ICD-10-CM | POA: Diagnosis not present

## 2015-05-19 DIAGNOSIS — M545 Low back pain: Secondary | ICD-10-CM | POA: Diagnosis not present

## 2015-07-18 DIAGNOSIS — H1859 Other hereditary corneal dystrophies: Secondary | ICD-10-CM | POA: Diagnosis not present

## 2015-07-18 DIAGNOSIS — Z961 Presence of intraocular lens: Secondary | ICD-10-CM | POA: Diagnosis not present

## 2015-07-18 DIAGNOSIS — Z0100 Encounter for examination of eyes and vision without abnormal findings: Secondary | ICD-10-CM | POA: Diagnosis not present

## 2015-09-19 ENCOUNTER — Other Ambulatory Visit: Payer: Self-pay | Admitting: Cardiovascular Disease

## 2015-09-19 NOTE — Telephone Encounter (Signed)
Rx request sent to pharmacy.  

## 2015-09-27 ENCOUNTER — Other Ambulatory Visit: Payer: Self-pay | Admitting: Cardiovascular Disease

## 2015-10-29 ENCOUNTER — Other Ambulatory Visit: Payer: Self-pay | Admitting: Cardiology

## 2015-10-31 ENCOUNTER — Ambulatory Visit (INDEPENDENT_AMBULATORY_CARE_PROVIDER_SITE_OTHER): Payer: Medicare Other | Admitting: Cardiovascular Disease

## 2015-10-31 ENCOUNTER — Encounter: Payer: Self-pay | Admitting: Cardiovascular Disease

## 2015-10-31 VITALS — BP 120/54 | HR 64 | Ht 63.0 in | Wt 116.0 lb

## 2015-10-31 DIAGNOSIS — I10 Essential (primary) hypertension: Secondary | ICD-10-CM

## 2015-10-31 DIAGNOSIS — I214 Non-ST elevation (NSTEMI) myocardial infarction: Secondary | ICD-10-CM | POA: Diagnosis not present

## 2015-10-31 DIAGNOSIS — E785 Hyperlipidemia, unspecified: Secondary | ICD-10-CM

## 2015-10-31 DIAGNOSIS — Z79899 Other long term (current) drug therapy: Secondary | ICD-10-CM | POA: Diagnosis not present

## 2015-10-31 NOTE — Progress Notes (Signed)
10/31/2015 Jo Johnson   1924/09/02  [id number]  Primary Physician Sheryn Bison, MD Primary Cardiologist: Runell Gess MD Nicholes Calamity, [place]  HPI:  Jo Johnson is a 82 year old  Female who is accompanied by her son today. I last saw her in the office 10/28/14. She has a history of hypertension and acid reflux, who presented to [facility] on 06/21/12 with a complaint of chest pain with radiation to both upper extremities and associated dizziness. Her EKG was normal and without acute changes. Her initial troponin was negative. However, she was admitted for obsevation and for MI rule-out. The initial plan was to have the patient undergo nuclear stress testing if serial troponins were negative. However, she ruled in for ACS/NSTEMI with mildly elevated troponin levels, and the decision was made to convert from non-invasive Cardiolite stress test to cardiac catheterization. Her troponin peaked at 0.47. The procedure was performed by Dr. Herbie Baltimore, via the right radial artery. The catheterization demonstrated severe 2 vessel disease. The cultprit vessel was a 85-90% proximal stenosis of the Circumflex artery. This was successfully treated with PCI with a DES. She was also noted to have a distal OM that was 90% stenosed, along with extensive moderate to severe proximal to mid RCA disease. She had only moderate LAD disease. She had normal LV function with normal LVEDP. The plan was to treat the diffuse RCA lesions medically. She left the cath lab in stable condition and was started on dual antiplatelet therapy with ASA and Brilinta. A beta blocker and a stain were initiated. She was assessed by cardiac rehab.  She presents today for followup to her hospitalization and MI. According to her son, who has been staying with her, she does have some chronic shortness of breath which has had for years but has had not had any worsening symptoms. She and her son also reported that she had some chest discomfort  with left arm pain which he noticed after waking up and may nap when she was lying on her side on the couch. The pain resolved after moving her arm and adjusting her body position. She's also gets tired while doing the clothes washing.  Since I saw her in the office one year ago she's medically stable and denies chest pain or shortness of breath.  Current Outpatient Prescriptions  Medication Sig Dispense Refill  . amLODipine (NORVASC) 5 MG tablet Take 5 mg by mouth daily.    Marland Kitchen aspirin EC 81 MG EC tablet Take 1 tablet (81 mg total) by mouth daily.    Marland Kitchen atorvastatin (LIPITOR) 10 MG tablet TAKE 1 TABLET BY MOUTH DAILY AT 6 PM 30 tablet 5  . B Complex Vitamins (B COMPLEX PO) Take 1 tablet by mouth daily.    . Calcium Carbonate (CALCIUM 600 PO) Take 1 tablet by mouth daily.    . Cholecalciferol (VITAMIN D-3) 1000 units CAPS Take 1 capsule by mouth daily.    Marland Kitchen losartan (COZAAR) 50 MG tablet Take 50 mg by mouth daily.     . metoprolol tartrate (LOPRESSOR) 25 MG tablet TAKE 1/2 TABLET BY MOUTH TWICE DAILY 90 tablet 1  . metoprolol tartrate (LOPRESSOR) 25 MG tablet TAKE 1/2 TABLET BY MOUTH TWICE DAILY 90 tablet 0  . Multiple Vitamins-Minerals (VISION FORMULA EYE HEALTH PO) Take 1 tablet by mouth daily.    . nitroGLYCERIN (NITROSTAT) 0.4 MG SL tablet Place 1 tablet (0.4 mg total) under the tongue every 5 (five) minutes x 3 doses as  needed for chest pain. 25 tablet 2  . pantoprazole (PROTONIX) 40 MG tablet TAKE 1 TABLET BY MOUTH EVERY DAY 30 tablet 3   No current facility-administered medications for this visit.     No Known Allergies  Social History   Social History  . Marital status: Divorced    Spouse name: N/A  . Number of children: N/A  . Years of education: N/A   Occupational History  . Not on file.   Social History Main Topics  . Smoking status: Never Smoker  . Smokeless tobacco: Current User    Types: Chew  . Alcohol use No  . Drug use: No  . Sexual activity: No   Other Topics  Concern  . Not on file   Social History Narrative  . No narrative on file     Review of Systems: General: negative for chills, fever, night sweats or weight changes.  Cardiovascular: negative for chest pain, dyspnea on exertion, edema, orthopnea, palpitations, paroxysmal nocturnal dyspnea or shortness of breath Dermatological: negative for rash Respiratory: negative for cough or wheezing Urologic: negative for hematuria Abdominal: negative for nausea, vomiting, diarrhea, bright red blood per rectum, melena, or hematemesis Neurologic: negative for visual changes, syncope, or dizziness All other systems reviewed and are otherwise negative except as noted above.    Blood pressure (!) 120/54, pulse 64, height 5\' 3"  (1.6 m), weight 116 lb (52.6 kg).  General appearance: alert and no distress Neck: no adenopathy, no carotid bruit, no JVD, supple, symmetrical, trachea midline and thyroid not enlarged, symmetric, no tenderness/mass/nodules Lungs: clear to auscultation bilaterally Heart: regular rate and rhythm, S1, S2 normal, no murmur, click, rub or gallop Extremities: extremities normal, atraumatic, no cyanosis or edema  EKG normal sinus rhythm at 64 PVCs and sinus arrhythmia with septal Q waves. I personally reviewed this EKG  ASSESSMENT AND PLAN:   Essential hypertension History of hypertension blood pressure measurements at 120/54. She is on losartan, metoprolol and amlodipine. Continue current meds at current dosing  NSTEMI - S/P PCI + DES to Prox Circumflex History of CAD status post non-STEMI of cement radial artery catheterization performed by Dr. Herbie BaltimoreHarding 06/22/12. She had a Promus from ER stent placed in the circumflex coronary artery with moderate disease in the RCA that was treated medically. Since that time she has been on dual antiplatelet therapy. She denies chest pain or shortness of breath. I'm going to stop her Brilenta and continue low-dose  aspirin.  Hyperlipidemia History of hyperlipidemia on statin therapy. We will recheck a lipid and liver profile      Runell GessJonathan J. Jersey Espinoza MD [facility] SouthlakeFACP,FACC,FAHA, [facility] HospitalFSCAI 10/31/2015 1:48 PM

## 2015-10-31 NOTE — Patient Instructions (Signed)
Medication Instructions:  Your physician has recommended you make the following change in your medication:  1- STOP taking your Brilinta.   Labwork: Your physician recommends that you return for lab work in: [employer]. You will need to be fasting.   Follow-Up: Your physician wants you to follow-up in: 12 MONTHS WITH DR Allyson SabalBERRY.  You will receive a reminder letter in the mail two months in advance. If you don't receive a letter, please call our office to schedule the follow-up appointment.  If you need a refill on your cardiac medications before your next appointment, please call your pharmacy.

## 2015-10-31 NOTE — Assessment & Plan Note (Signed)
History of CAD status post non-STEMI of cement radial artery catheterization performed by Dr. Herbie BaltimoreHarding 06/22/12. She had a Promus from ER stent placed in the circumflex coronary artery with moderate disease in the RCA that was treated medically. Since that time she has been on dual antiplatelet therapy. She denies chest pain or shortness of breath. I'm going to stop her Brilenta and continue low-dose aspirin.

## 2015-10-31 NOTE — Assessment & Plan Note (Addendum)
History of hypertension blood pressure measurements at 120/54. She is on losartan, metoprolol and amlodipine. Continue current meds at current dosing

## 2015-10-31 NOTE — Assessment & Plan Note (Signed)
History of hyperlipidemia on statin therapy. We will recheck a lipid and liver profile 

## 2015-11-21 ENCOUNTER — Emergency Department (HOSPITAL_COMMUNITY)
Admission: EM | Admit: 2015-11-21 | Discharge: 2015-11-21 | Disposition: A | Discharge status: Home / Self Care | Payer: Medicare Other | Attending: Emergency Medicine | Admitting: Emergency Medicine

## 2015-11-21 ENCOUNTER — Telehealth: Payer: Self-pay | Admitting: Cardiovascular Disease

## 2015-11-21 ENCOUNTER — Encounter (HOSPITAL_COMMUNITY): Payer: Self-pay

## 2015-11-21 ENCOUNTER — Emergency Department (HOSPITAL_COMMUNITY): Payer: Medicare Other

## 2015-11-21 DIAGNOSIS — R079 Chest pain, unspecified: Secondary | ICD-10-CM | POA: Insufficient documentation

## 2015-11-21 DIAGNOSIS — J45909 Unspecified asthma, uncomplicated: Secondary | ICD-10-CM | POA: Insufficient documentation

## 2015-11-21 DIAGNOSIS — R0789 Other chest pain: Secondary | ICD-10-CM | POA: Diagnosis not present

## 2015-11-21 DIAGNOSIS — I2510 Atherosclerotic heart disease of native coronary artery without angina pectoris: Secondary | ICD-10-CM | POA: Diagnosis not present

## 2015-11-21 DIAGNOSIS — I252 Old myocardial infarction: Secondary | ICD-10-CM | POA: Insufficient documentation

## 2015-11-21 DIAGNOSIS — Z7982 Long term (current) use of aspirin: Secondary | ICD-10-CM | POA: Insufficient documentation

## 2015-11-21 DIAGNOSIS — I10 Essential (primary) hypertension: Secondary | ICD-10-CM | POA: Insufficient documentation

## 2015-11-21 LAB — COMPREHENSIVE METABOLIC PANEL
ALK PHOS: 49 U/L (ref 38–126)
ALT: 13 U/L — AB (ref 14–54)
AST: 22 U/L (ref 15–41)
Albumin: 3.6 g/dL (ref 3.5–5.0)
Anion gap: 8 (ref 5–15)
BILIRUBIN TOTAL: 0.4 mg/dL (ref 0.3–1.2)
BUN: 19 mg/dL (ref 6–20)
CALCIUM: 9 mg/dL (ref 8.9–10.3)
CO2: 27 mmol/L (ref 22–32)
CREATININE: 1 mg/dL (ref 0.44–1.00)
Chloride: 106 mmol/L (ref 101–111)
GFR calc Af Amer: 56 mL/min — ABNORMAL LOW (ref >60)
GFR, EST NON AFRICAN AMERICAN: 48 mL/min — AB (ref >60)
Glucose, Bld: 108 mg/dL — ABNORMAL HIGH (ref 65–99)
POTASSIUM: 3.8 mmol/L (ref 3.5–5.1)
Sodium: 141 mmol/L (ref 135–145)
TOTAL PROTEIN: 6.1 g/dL — AB (ref 6.5–8.1)

## 2015-11-21 LAB — CBC WITH DIFFERENTIAL/PLATELET
BASOS ABS: 0 10*3/uL (ref 0.0–0.1)
Basophils Relative: 0 %
Eosinophils Absolute: 0.2 10*3/uL (ref 0.0–0.7)
Eosinophils Relative: 3 %
HEMATOCRIT: 36 % (ref 36.0–46.0)
HEMOGLOBIN: 11.5 g/dL — AB (ref 12.0–15.0)
LYMPHS PCT: 38 %
Lymphs Abs: 2.1 10*3/uL (ref 0.7–4.0)
MCH: 30.7 pg (ref 26.0–34.0)
MCHC: 31.9 g/dL (ref 30.0–36.0)
MCV: 96.3 fL (ref 78.0–100.0)
MONO ABS: 0.4 10*3/uL (ref 0.1–1.0)
MONOS PCT: 7 %
NEUTROS ABS: 2.8 10*3/uL (ref 1.7–7.7)
Neutrophils Relative %: 52 %
Platelets: 173 10*3/uL (ref 150–400)
RBC: 3.74 MIL/uL — ABNORMAL LOW (ref 3.87–5.11)
RDW: 13.4 % (ref 11.5–15.5)
WBC: 5.5 10*3/uL (ref 4.0–10.5)

## 2015-11-21 LAB — I-STAT TROPONIN, ED: Troponin i, poc: 0 ng/mL (ref 0.00–0.08)

## 2015-11-21 LAB — TROPONIN I

## 2015-11-21 NOTE — ED Provider Notes (Signed)
[facility]-EMERGENCY DEPT Provider Note   CSN: [id number] Arrival date & time: 11/21/15  4098  By signing my name below, I, Jo Johnson, attest that this documentation has been prepared under the direction and in the presence of Jo Bilis, MD.  Electronically Signed: Octavia Johnson, ED Scribe. 11/21/15. 4:01 AM.    History   Chief Complaint Chief Complaint  Patient presents with  . Chest Pain    The history is provided by the patient and the EMS personnel. No language interpreter was used.   HPI Comments: Jo Johnson is a 82 y.o. female brought in by ambulance, who has a PMhx of chronic cough, CAD, HLD, HTN, and NSTEMI presents to the Emergency Department complaining of sudden onset, resolved, moderate central chest pain that started about 2 hours ago. Pt notes feeling nausea and having her chest pain radiate into her left arm. Pt reports having a "hurting" that alleviated when EMS arrived. She says her pain lasted about 30 minutes. Pt says that she does not have any pain currently. Pt notes her symptoms were very similar to her prior hx of MI. She received nitroglycerin and aspirin from EMS with relief. Per son, pt was seen at her cardiologist's office, Dr. Allyson Johnson and was taken off of her Brilinta and she is convinced that they may be the cause of her chest pain. Denies fever or any new symptoms.   Past Medical History:  Diagnosis Date  . Acid reflux   . Asthma   . Chronic cough   . Coronary artery disease    2 vessel CAD with normal LV function status post left circumflex stenting with a drug-eluting stent by Dr. Bryan Johnson  . Hyperlipidemia   . Hypertension     Patient Active Problem List   Diagnosis Date Noted  . Hyperlipidemia 10/16/2012  . CAD (coronary artery disease) 07/01/2012  . NSTEMI - S/P PCI + DES to Prox Circumflex 06/23/2012  . Unstable angina (HCC) 06/21/2012  . INTRINSIC ASTHMA, WITH EXACERBATION 06/22/2008  . INTRINSIC ASTHMA, UNSPECIFIED 12/25/2007    . Essential hypertension 12/04/2007  . Cough 12/04/2007    Past Surgical History:  Procedure Laterality Date  . APPENDECTOMY    . CHOLECYSTECTOMY    . EYE SURGERY    . LEFT HEART CATHETERIZATION WITH CORONARY ANGIOGRAM N/A 06/22/2012   Procedure: LEFT HEART CATHETERIZATION WITH CORONARY ANGIOGRAM;  Surgeon: Jo Lex, MD;  Location[facility];  Service: Cardiovascular;  Laterality: N/A;  . PERCUTANEOUS CORONARY STENT INTERVENTION (PCI-S)  06/22/2012   Procedure: PERCUTANEOUS CORONARY STENT INTERVENTION (PCI-S);  Surgeon: Jo Lex, MD;  Location: [facility] CATH LAB;  Service: Cardiovascular;;  . surgery to legs d/t trauma      OB History    No data available       Home Medications    Prior to Admission medications   Medication Sig Start Date End Date Taking? Authorizing Provider  amLODipine (NORVASC) 5 MG tablet Take 5 mg by mouth daily.    Historical Provider, MD  aspirin EC 81 MG EC tablet Take 1 tablet (81 mg total) by mouth daily. 06/23/12   Jo Sherlynn Carbon, PA-C  atorvastatin (LIPITOR) 10 MG tablet TAKE 1 TABLET BY MOUTH DAILY AT 6 PM 12/10/12   Jo Gess, MD  B Complex Vitamins (B COMPLEX PO) Take 1 tablet by mouth daily.    Historical Provider, MD  Calcium Carbonate (CALCIUM 600 PO) Take 1 tablet by mouth daily.    Historical Provider,  MD  Cholecalciferol (VITAMIN D-3) 1000 units CAPS Take 1 capsule by mouth daily.    Historical Provider, MD  losartan (COZAAR) 50 MG tablet Take 50 mg by mouth daily.  08/10/13   Historical Provider, MD  metoprolol tartrate (LOPRESSOR) 25 MG tablet TAKE 1/2 TABLET BY MOUTH TWICE DAILY 09/19/15   Jo GessJonathan J Berry, MD  metoprolol tartrate (LOPRESSOR) 25 MG tablet TAKE 1/2 TABLET BY MOUTH TWICE DAILY 09/27/15   Jo GessJonathan J Berry, MD  Multiple Vitamins-Minerals (VISION FORMULA EYE HEALTH PO) Take 1 tablet by mouth daily.    Historical Provider, MD  nitroGLYCERIN (NITROSTAT) 0.4 MG SL tablet Place 1 tablet (0.4 mg total) under the tongue  every 5 (five) minutes x 3 doses as needed for chest pain. 03/16/14   Jo GessJonathan J Berry, MD  pantoprazole (PROTONIX) 40 MG tablet TAKE 1 TABLET BY MOUTH EVERY DAY 10/30/15   Jo PocheJonathan F Branch, MD    Family History Family History  Problem Relation Age of Onset  . CVA Mother   . Cancer Father   . Dementia Brother   . COPD Brother     Social History Social History  Substance Use Topics  . Smoking status: Never Smoker  . Smokeless tobacco: Current User    Types: Chew  . Alcohol use No     Allergies   Review of patient's allergies indicates no known allergies.   Review of Systems Review of Systems  A complete 10 system review of systems was obtained and all systems are negative except as noted in the HPI and PMH.   Physical Exam Updated Vital Signs BP 113/69   Pulse 67   Temp 97.8 F (36.6 C) (Oral)   Resp 20   Wt 116 lb (52.6 kg)   SpO2 97%   BMI 20.55 kg/m   Physical Exam  Constitutional: She is oriented to person, place, and time. She appears well-developed and well-nourished. No distress.  HENT:  Head: Normocephalic and atraumatic.  Eyes: EOM are normal.  Neck: Normal range of motion.  Cardiovascular: Normal rate, regular rhythm and normal heart sounds.   Pulmonary/Chest: Effort normal and breath sounds normal.  Abdominal: Soft. She exhibits no distension. There is no tenderness.  Musculoskeletal: Normal range of motion.  Neurological: She is alert and oriented to person, place, and time.  Skin: Skin is warm and dry.  Psychiatric: She has a normal mood and affect. Judgment normal.  Nursing note and vitals reviewed.    ED Treatments / Results  DIAGNOSTIC STUDIES: Oxygen Saturation is 97% on RA, normal by my interpretation.  COORDINATION OF CARE:  4:01 AM Discussed treatment plan with pt at bedside and pt agreed to plan.  Labs (all labs ordered are listed, but only abnormal results are displayed) Labs Reviewed  CBC WITH DIFFERENTIAL/PLATELET -  Abnormal; Notable for the following:       Result Value   RBC 3.74 (*)    Hemoglobin 11.5 (*)    All other components within normal limits  COMPREHENSIVE METABOLIC PANEL - Abnormal; Notable for the following:    Glucose, Bld 108 (*)    Total Protein 6.1 (*)    ALT 13 (*)    GFR calc non Af Amer 48 (*)    GFR calc Af Amer 56 (*)    All other components within normal limits  TROPONIN I  I-STAT TROPOININ, ED    EKG ECG interpretation #1  Date: 11/21/2015  Rate: 70  Rhythm: normal sinus rhythm  QRS Axis:  normal  Intervals: normal  ST/T Wave abnormalities: normal  Conduction Disutrbances: none  Narrative Interpretation:   Old EKG Reviewed: No significant changes noted    ECG interpretation #2  Date: 11/21/2015  Rate: 59  Rhythm: normal sinus rhythm  QRS Axis: normal  Intervals: normal  ST/T Wave abnormalities: normal  Conduction Disutrbances: none  Narrative Interpretation:   Old EKG Reviewed: No significant changes noted        Radiology Dg Chest 2 View  Result Date: 11/21/2015 CLINICAL DATA:  Midchest pain extending into the left upper extremity, onset tonight. EXAM: CHEST  2 VIEW COMPARISON:  06/21/2012 FINDINGS: Unchanged right hemidiaphragm elevation. The lungs are clear. No pleural effusion. Normal pulmonary vasculature. Normal heart size. Hilar and mediastinal contours are normal unchanged. IMPRESSION: No active cardiopulmonary disease. Electronically Signed   By: Ellery Plunkaniel R Mitchell M.D.   On: 11/21/2015 04:50    Procedures Procedures (including critical care time)  Medications Ordered in ED Medications - No data to display   Initial Impression / Assessment and Plan / ED Course  I have reviewed the triage vital signs and the nursing notes.  Pertinent labs & imaging results that were available during my care of the patient were reviewed by me and considered in my medical decision making (see chart for details).  Clinical Course   Patient is overall  well-appearing.  Atypical symptoms as sharp in nature.  Will observe the patient closely in the emergency department with serial EKG and serial troponins.  Pain-free at this time.  Well-appearing.   Final Clinical Impressions(s) / ED Diagnoses   Final diagnoses:  None   I personally performed the services described in this documentation, which was scribed in my presence. The recorded information has been reviewed and is accurate.     New Prescriptions New Prescriptions   No medications on file     Jo BilisKevin Sina Sumpter, MD 11/21/15 [phone]

## 2015-11-21 NOTE — Telephone Encounter (Signed)
Spoke with patient's son, ok per DPR. Patient was in ED last night for chest pain and ER doctor said to f/u wth cardiologist. Son stated by the time EMS got to her house and then to ED the chest pain had subsided. Patient has been saying that since Dr Allyson SabalBerry took her off her Brilinta on 10/31/15 at office visit, she "just doesn't feel well." Family has encouraged patient that it is ok to be off that medication. Appt made with Azalee CourseHao Meng for 11/24/15 for f/u from ED visit. Son verbalized understanding.

## 2015-11-21 NOTE — Telephone Encounter (Signed)
Patient was in ER 11/20/15 pm and was told by Dr. Patria Maneampos that she should make an appointment to f/u her CP episode last night.  Not sure how soon she should be seen. Please call patient's son regarding when she should be seen

## 2015-11-21 NOTE — ED Notes (Signed)
Patient taken to XRAY

## 2015-11-21 NOTE — ED Triage Notes (Signed)
Patient here for chest pain, since 0130, sharpe in nature and radiates from center of chest to left arm.hx of MI 4 year ago, pt reports some nausea and took 4 ntg pta and asa given by ems. Pt now pain free.

## 2015-11-23 DIAGNOSIS — Z6820 Body mass index (BMI) 20.0-20.9, adult: Secondary | ICD-10-CM | POA: Diagnosis not present

## 2015-11-23 DIAGNOSIS — I2510 Atherosclerotic heart disease of native coronary artery without angina pectoris: Secondary | ICD-10-CM | POA: Diagnosis not present

## 2015-11-23 DIAGNOSIS — M545 Low back pain: Secondary | ICD-10-CM | POA: Diagnosis not present

## 2015-11-23 DIAGNOSIS — R2689 Other abnormalities of gait and mobility: Secondary | ICD-10-CM | POA: Diagnosis not present

## 2015-11-23 DIAGNOSIS — I10 Essential (primary) hypertension: Secondary | ICD-10-CM | POA: Diagnosis not present

## 2015-11-24 ENCOUNTER — Encounter: Payer: Self-pay | Admitting: Physician Assistant

## 2015-11-24 ENCOUNTER — Ambulatory Visit (INDEPENDENT_AMBULATORY_CARE_PROVIDER_SITE_OTHER): Payer: Medicare Other | Admitting: Physician Assistant

## 2015-11-24 VITALS — BP 102/56 | HR 68 | Ht 63.0 in | Wt 116.0 lb

## 2015-11-24 DIAGNOSIS — E785 Hyperlipidemia, unspecified: Secondary | ICD-10-CM | POA: Diagnosis not present

## 2015-11-24 DIAGNOSIS — I10 Essential (primary) hypertension: Secondary | ICD-10-CM | POA: Diagnosis not present

## 2015-11-24 DIAGNOSIS — R079 Chest pain, unspecified: Secondary | ICD-10-CM | POA: Diagnosis not present

## 2015-11-24 DIAGNOSIS — I25118 Atherosclerotic heart disease of native coronary artery with other forms of angina pectoris: Secondary | ICD-10-CM | POA: Diagnosis not present

## 2015-11-24 DIAGNOSIS — Z79899 Other long term (current) drug therapy: Secondary | ICD-10-CM | POA: Diagnosis not present

## 2015-11-24 DIAGNOSIS — I209 Angina pectoris, unspecified: Secondary | ICD-10-CM

## 2015-11-24 MED ORDER — ISOSORBIDE MONONITRATE ER 30 MG PO TB24: 15.0000 mg | ORAL_TABLET | Freq: Every day | ORAL | 1 refills | Status: DC

## 2015-11-24 MED ORDER — LOSARTAN POTASSIUM 25 MG PO TABS: 25.0000 mg | ORAL_TABLET | Freq: Every day | ORAL | 1 refills | Status: DC

## 2015-11-24 NOTE — Progress Notes (Signed)
Cardiology Office Note    Date:  11/24/2015   ID:  Jo Johnson, DOB 02/17/1924, MRN [id number]  PCP:  Verl Blalock, MD  Cardiologist:  Dr. Gwenlyn Found   Chief Complaint  Patient presents with  . Follow-up    seen for Dr. Gwenlyn Found    History of Present Illness:  Jo Johnson is a 82 y.o. female with PMH of GERD, CAD with PCI circumflex, hypertension and hyperlipidemia. Her last cardiac catheterization on 06/22/2012 showed 20-30% proximal LAD stenosis, 85% proximal left circumflex stenosis treated with 3.0 x 18 mm Promus premiere DES, she also had calcified 50-60% stenosis in RCA and a 90% stenosis in distal OM. She had normal LV function and normal LVEDP. RCA disease was treated medically. Patient was most recently seen on 10/31/2015, and which time she denies any chest discomfort or shortness of breath. Her Brilinta was stopped. She presented back to the emergency room on 11/21/2015 for evaluation of chest pain, her symptom was felt to be atypical. Troponin was negative. EKG showed no ischemic changes. She was discharged from the ED to follow-up with cardiology as outpatient.   She presents to the cardiology office for follow-up. According to the patient, she does not remember her original symptom with her initial heart attack. Per son, she did have some chest pain and also shortness of breath back then but does not remember if she had any arm pain. According to the patient, she did have left arm pain this time along with dull substernal chest pain. She says this is not the only episode of chest pain she had recently, she is been having some chest pain at rest and also noticeably during exertion when she "pushed herself". The exertional component is somewhat concerning. She is planning to have fasting lipid test and liver function test today. I will also order an outpatient Lexiscan Myoview to risk stratify, if Myoview negative, she does not need any further workup. However if Myoview shows small  area of ischemia, we can potentially consider medical therapy. If it does show large area of ischemia, then we will need to discuss with primary cardiologist.    Past Medical History:  Diagnosis Date  . Acid reflux   . Asthma   . Chronic cough   . Coronary artery disease    2 vessel CAD with normal LV function status post left circumflex stenting with a drug-eluting stent by Dr. Glenetta Hew  . Hyperlipidemia   . Hypertension     Past Surgical History:  Procedure Laterality Date  . APPENDECTOMY    . CHOLECYSTECTOMY    . EYE SURGERY    . LEFT HEART CATHETERIZATION WITH CORONARY ANGIOGRAM N/A 06/22/2012   Procedure: LEFT HEART CATHETERIZATION WITH CORONARY ANGIOGRAM;  Surgeon: Leonie Man, MD;  Location: [facility] CATH LAB;  Service: Cardiovascular;  Laterality: N/A;  . PERCUTANEOUS CORONARY STENT INTERVENTION (PCI-S)  06/22/2012   Procedure: PERCUTANEOUS CORONARY STENT INTERVENTION (PCI-S);  Surgeon: Leonie Man, MD;  Location: [facility] CATH LAB;  Service: Cardiovascular;;  . surgery to legs d/t trauma      Current Medications: Outpatient Medications Prior to Visit  Medication Sig Dispense Refill  . acetaminophen (TYLENOL) 500 MG tablet Take 500-1,000 mg by mouth every 6 (six) hours as needed for mild pain.    Marland Kitchen aspirin EC 81 MG EC tablet Take 1 tablet (81 mg total) by mouth daily.    Marland Kitchen atorvastatin (LIPITOR) 10 MG tablet TAKE 1 TABLET BY MOUTH DAILY AT 6  PM 30 tablet 5  . B Complex Vitamins (B COMPLEX PO) Take 1 tablet by mouth daily.    . Calcium Carbonate (CALCIUM 600 PO) Take 1 tablet by mouth daily.    . Cholecalciferol (VITAMIN D-3) 1000 units CAPS Take 1 capsule by mouth daily.    . metoprolol tartrate (LOPRESSOR) 25 MG tablet TAKE 1/2 TABLET BY MOUTH TWICE DAILY 90 tablet 1  . Multiple Vitamins-Minerals (VISION FORMULA EYE HEALTH PO) Take 1 tablet by mouth daily.    . nitroGLYCERIN (NITROSTAT) 0.4 MG SL tablet Place 1 tablet (0.4 mg total) under the tongue every 5 (five) minutes  x 3 doses as needed for chest pain. 25 tablet 2  . pantoprazole (PROTONIX) 40 MG tablet TAKE 1 TABLET BY MOUTH EVERY DAY 30 tablet 3  . amLODipine (NORVASC) 5 MG tablet Take 5 mg by mouth daily.    Marland Kitchen losartan (COZAAR) 50 MG tablet Take 50 mg by mouth daily.      No facility-administered medications prior to visit.      Allergies:   Review of patient's allergies indicates no known allergies.   Social History   Social History  . Marital status: Divorced    Spouse name: N/A  . Number of children: N/A  . Years of education: N/A   Social History Main Topics  . Smoking status: Never Smoker  . Smokeless tobacco: Current User    Types: Chew  . Alcohol use No  . Drug use: No  . Sexual activity: No   Other Topics Concern  . Not on file   Social History Narrative  . No narrative on file     Family History:  The patient's family history includes COPD in her brother; CVA in her mother; Cancer in her father; Dementia in her brother.   ROS:   Please see the history of present illness.    ROS All other systems reviewed and are negative.   PHYSICAL EXAM:   VS:  BP (!) 102/56 (BP Location: Right Arm, Patient Position: Sitting, Cuff Size: Normal)   Pulse 68   Ht '5\' 3"'$  (1.6 m)   Wt 116 lb (52.6 kg)   SpO2 97%   BMI 20.55 kg/m    GEN: Well nourished, well developed, in no acute distress  HEENT: normal  Neck: no JVD, carotid bruits, or masses Cardiac: RRR; no murmurs, rubs, or gallops,no edema  Respiratory:  clear to auscultation bilaterally, normal work of breathing GI: soft, nontender, nondistended, + BS MS: no deformity or atrophy  Skin: warm and dry, no rash Neuro:  Alert and Oriented x 3, Strength and sensation are intact Psych: euthymic mood, full affect  Wt Readings from Last 3 Encounters:  11/24/15 116 lb (52.6 kg)  11/21/15 116 lb (52.6 kg)  10/31/15 116 lb (52.6 kg)      Studies/Labs Reviewed:   EKG:  EKG is ordered today.  The ekg ordered today demonstrates  Normal sinus rhythm, no significant ST-T wave changes.  Recent Labs: 11/21/2015: ALT 13; BUN 19; Creatinine, Ser 1.00; Hemoglobin 11.5; Platelets 173; Potassium 3.8; Sodium 141   Lipid Panel    Component Value Date/Time   CHOL 153 06/22/2012 0520   TRIG 100 06/22/2012 0520   HDL 45 06/22/2012 0520   CHOLHDL 3.4 06/22/2012 0520   VLDL 20 06/22/2012 0520   LDLCALC 88 06/22/2012 0520    Additional studies/ records that were reviewed today include:   Cath 06/22/2012  Percutaneous Coronary Intervention:  Sheath exchanged for  6 Fr Lesion: Proximal circumflex hazy, focal 85% likely thrombotic stenosis Guide: 6 Fr   XBLAD 3.5        Guidewire: Prowater Predilation Balloon: Emerge MR 2.0 mm x 12 mm;  ? 8 Atm x 30 Sec Stent: Promus Premier DES 3.0 mm x 16 mm; the additional length was used simply to avoid the edges the stent being in the possible bed of the vessel. ? Deployment: 12 Atm x 30 Sec, ? Post-dilation with Stent Balloon: 16 mm x 60 mm;  ? Final Diameter: 3.2 mm  Post deployment angiography in multiple views, with and without guidewire in place revealed excellent stent deployment and lesion coverage.  There was no evidence of    ASSESSMENT:    1. Chest pain, unspecified type   2. Coronary artery disease involving native coronary artery of native heart with other form of angina pectoris ([place])   3. Essential hypertension   4. Hyperlipidemia, unspecified hyperlipidemia type      PLAN:  In order of problems listed above:  1. Chest pain with and without exertion - She has been having some chest discomfort with and without exertion, the most concerning aspect is when she said she has intermittent chest pain when she "push herself" doing things. She did have negative troponin on recent ED visit. I think she should be risk stratified with a Myoview. As long as Myoview is normal or the area of ischemia is small, I think we can pursue medical therapy, however if Myoview shows large  area of ischemia, we will discuss with Dr. Gwenlyn Found.  2. CAD: Continue aspirin, her Brilinta was recently discontinued as her last cardiac cath was in 2014.   3. HTN: Low pressure is borderline low today, I would discontinue her amlodipine, start 15 mg daily Imdur. Also cut back losartan to 25 mg daily.  4. HLD: Pending fasting lipid panel and LFTs today.    Medication Adjustments/Labs and Tests Ordered: Current medicines are reviewed at length with the patient today.  Concerns regarding medicines are outlined above.  Medication changes, Labs and Tests ordered today are listed in the Patient Instructions below. Patient Instructions  Medication Instructions: Discontinue Amlodipine  Decrease Losartan to 25 mg daily.  START Isosorbide 30 mg--take 1/2 tablet (15 mg) daily   Labwork: Your physician recommends that you return for lab work today--LFT, LIPID   Testing/Procedures: Your physician has requested that you have a lexiscan myoview. For further information please visit [email or web address]. Please follow instruction sheet, as given.    Follow-Up: Your physician recommends that you schedule a follow-up appointment in: 1 month with Dr. Gwenlyn Found or sooner pending stress test results.   Any Other Special Instructions   Pharmacologic Stress Electrocardiogram A pharmacologic stress electrocardiogram is a heart (cardiac) test that uses nuclear imaging to evaluate the blood supply to your heart. This test may also be called a pharmacologic stress electrocardiography. Pharmacologic means that a medicine is used to increase your heart rate and blood pressure.  This stress test is done to find areas of poor blood flow to the heart by determining the extent of coronary artery disease (CAD). Some people exercise on a treadmill, which naturally increases the blood flow to the heart. For those people unable to exercise on a treadmill, a medicine is used. This medicine stimulates your heart and will  cause your heart to beat harder and more quickly, as if you were exercising.  Pharmacologic stress tests can help determine:  The adequacy of blood flow  to your heart during increased levels of activity in order to clear you for discharge home.  The extent of coronary artery blockage caused by CAD.  Your prognosis if you have suffered a heart attack.  The effectiveness of cardiac procedures done, such as an angioplasty, which can increase the circulation in your coronary arteries.  Causes of chest pain or pressure. LET [facility] CARE PROVIDER KNOW ABOUT:  Any allergies you have.  All medicines you are taking, including vitamins, herbs, eye drops, creams, and over-the-counter medicines.  Previous problems you or members of your family have had with the use of anesthetics.  Any blood disorders you have.  Previous surgeries you have had.  Medical conditions you have.  Possibility of pregnancy, if this applies.  If you are currently breastfeeding. RISKS AND COMPLICATIONS Generally, this is a safe procedure. However, as with any procedure, complications can occur. Possible complications include:  You develop pain or pressure in the following areas:  Chest.  Jaw or neck.  Between your shoulder blades.  Radiating down your left arm.  Headache.  Dizziness or light-headedness.  Shortness of breath.  Increased or irregular heartbeat.  Low blood pressure.  Nausea or vomiting.  Flushing.  Redness going up the arm and slight pain during injection of medicine.  Heart attack (rare). BEFORE THE PROCEDURE   Avoid all forms of caffeine for 24 hours before your test or as directed by your health care provider. This includes coffee, tea (even decaffeinated tea), caffeinated sodas, chocolate, cocoa, and certain pain medicines.  Follow your health care provider's instructions regarding eating and drinking before the test.  Take your medicines as directed at regular times  with water unless instructed otherwise. Exceptions may include:  If you have diabetes, ask how you are to take your insulin or pills. It is common to adjust insulin dosing the morning of the test.  If you are taking beta-blocker medicines, it is important to talk to your health care provider about these medicines well before the date of your test. Taking beta-blocker medicines may interfere with the test. In some cases, these medicines need to be changed or stopped 24 hours or more before the test.  If you wear a nitroglycerin patch, it may need to be removed prior to the test. Ask your health care provider if the patch should be removed before the test.  If you use an inhaler for any breathing condition, bring it with you to the test.  If you are an outpatient, bring a snack so you can eat right after the stress phase of the test.  Do not smoke for 4 hours prior to the test or as directed by your health care provider.  Do not apply lotions, powders, creams, or oils on your chest prior to the test.  Wear comfortable shoes and clothing. Let your health care provider know if you were unable to complete or follow the preparations for your test. PROCEDURE   Multiple patches (electrodes) will be put on your chest. If needed, small areas of your chest may be shaved to get better contact with the electrodes. Once the electrodes are attached to your body, multiple wires will be attached to the electrodes, and your heart rate will be monitored.  An IV access will be started. A nuclear trace (isotope) is given. The isotope may be given intravenously, or it may be swallowed. Nuclear refers to several types of radioactive isotopes, and the nuclear isotope lights up the arteries so that the  nuclear images are clear. The isotope is absorbed by your body. This results in low radiation exposure.  A resting nuclear image is taken to show how your heart functions at rest.  A medicine is given through the IV  access.  A second scan is done about 1 hour after the medicine injection and determines how your heart functions under stress.  During this stress phase, you will be connected to an electrocardiogram machine. Your blood pressure and oxygen levels will be monitored. AFTER THE PROCEDURE   Your heart rate and blood pressure will be monitored after the test.  You may return to your normal schedule, including diet,activities, and medicines, unless your health care provider tells you otherwise.   This information is not intended to replace advice given to you by your health care provider. Make sure you discuss any questions you have with your health care provider.   Document Released: 05/26/2008 Document Revised: 01/12/2013 Document Reviewed: 09/14/2012 Elsevier Interactive Patient Education [employer].    If you need a refill on your cardiac medications before your next appointment, please call your pharmacy.     Hilbert Corrigan, [place]  11/24/2015 12:29 PM    [address], [place], [place]  [address] Phone: [phone]; Fax: [phone]

## 2015-11-24 NOTE — Patient Instructions (Signed)
Medication Instructions: Discontinue Amlodipine  Decrease Losartan to 25 mg daily.  START Isosorbide 30 mg--take 1/2 tablet (15 mg) daily   Labwork: Your physician recommends that you return for lab work today--LFT, LIPID   Testing/Procedures: Your physician has requested that you have a lexiscan myoview. For further information please visit [email or web address]. Please follow instruction sheet, as given.    Follow-Up: Your physician recommends that you schedule a follow-up appointment in: 1 month with Dr. Allyson SabalBerry or sooner pending stress test results.   Any Other Special Instructions   Pharmacologic Stress Electrocardiogram A pharmacologic stress electrocardiogram is a heart (cardiac) test that uses nuclear imaging to evaluate the blood supply to your heart. This test may also be called a pharmacologic stress electrocardiography. Pharmacologic means that a medicine is used to increase your heart rate and blood pressure.  This stress test is done to find areas of poor blood flow to the heart by determining the extent of coronary artery disease (CAD). Some people exercise on a treadmill, which naturally increases the blood flow to the heart. For those people unable to exercise on a treadmill, a medicine is used. This medicine stimulates your heart and will cause your heart to beat harder and more quickly, as if you were exercising.  Pharmacologic stress tests can help determine:  The adequacy of blood flow to your heart during increased levels of activity in order to clear you for discharge home.  The extent of coronary artery blockage caused by CAD.  Your prognosis if you have suffered a heart attack.  The effectiveness of cardiac procedures done, such as an angioplasty, which can increase the circulation in your coronary arteries.  Causes of chest pain or pressure. [facility] KNOW ABOUT:  Any allergies you have.  All medicines you are taking, including  vitamins, herbs, eye drops, creams, and over-the-counter medicines.  Previous problems you or members of your family have had with the use of anesthetics.  Any blood disorders you have.  Previous surgeries you have had.  Medical conditions you have.  Possibility of pregnancy, if this applies.  If you are currently breastfeeding. RISKS AND COMPLICATIONS Generally, this is a safe procedure. However, as with any procedure, complications can occur. Possible complications include:  You develop pain or pressure in the following areas:  Chest.  Jaw or neck.  Between your shoulder blades.  Radiating down your left arm.  Headache.  Dizziness or light-headedness.  Shortness of breath.  Increased or irregular heartbeat.  Low blood pressure.  Nausea or vomiting.  Flushing.  Redness going up the arm and slight pain during injection of medicine.  Heart attack (rare). BEFORE THE PROCEDURE   Avoid all forms of caffeine for 24 hours before your test or as directed by your health care provider. This includes coffee, tea (even decaffeinated tea), caffeinated sodas, chocolate, cocoa, and certain pain medicines.  Follow your health care provider's instructions regarding eating and drinking before the test.  Take your medicines as directed at regular times with water unless instructed otherwise. Exceptions may include:  If you have diabetes, ask how you are to take your insulin or pills. It is common to adjust insulin dosing the morning of the test.  If you are taking beta-blocker medicines, it is important to talk to your health care provider about these medicines well before the date of your test. Taking beta-blocker medicines may interfere with the test. In some cases, these medicines need to be changed or stopped 24 hours  or more before the test.  If you wear a nitroglycerin patch, it may need to be removed prior to the test. Ask your health care provider if the patch should be  removed before the test.  If you use an inhaler for any breathing condition, bring it with you to the test.  If you are an outpatient, bring a snack so you can eat right after the stress phase of the test.  Do not smoke for 4 hours prior to the test or as directed by your health care provider.  Do not apply lotions, powders, creams, or oils on your chest prior to the test.  Wear comfortable shoes and clothing. Let your health care provider know if you were unable to complete or follow the preparations for your test. PROCEDURE   Multiple patches (electrodes) will be put on your chest. If needed, small areas of your chest may be shaved to get better contact with the electrodes. Once the electrodes are attached to your body, multiple wires will be attached to the electrodes, and your heart rate will be monitored.  An IV access will be started. A nuclear trace (isotope) is given. The isotope may be given intravenously, or it may be swallowed. Nuclear refers to several types of radioactive isotopes, and the nuclear isotope lights up the arteries so that the nuclear images are clear. The isotope is absorbed by your body. This results in low radiation exposure.  A resting nuclear image is taken to show how your heart functions at rest.  A medicine is given through the IV access.  A second scan is done about 1 hour after the medicine injection and determines how your heart functions under stress.  During this stress phase, you will be connected to an electrocardiogram machine. Your blood pressure and oxygen levels will be monitored. AFTER THE PROCEDURE   Your heart rate and blood pressure will be monitored after the test.  You may return to your normal schedule, including diet,activities, and medicines, unless your health care provider tells you otherwise.   This information is not intended to replace advice given to you by your health care provider. Make sure you discuss any questions you  have with your health care provider.   Document Released: 05/26/2008 Document Revised: 01/12/2013 Document Reviewed: 09/14/2012 Elsevier Interactive Patient Education [employer] Elsevier Inc.    If you need a refill on your cardiac medications before your next appointment, please call your pharmacy.

## 2015-11-25 LAB — HEPATIC FUNCTION PANEL
ALK PHOS: 55 U/L (ref 33–130)
ALT: 14 U/L (ref 6–29)
AST: 21 U/L (ref 10–35)
Albumin: 3.8 g/dL (ref 3.6–5.1)
BILIRUBIN DIRECT: 0.1 mg/dL (ref <=0.2)
BILIRUBIN TOTAL: 0.5 mg/dL (ref 0.2–1.2)
Indirect Bilirubin: 0.4 mg/dL (ref 0.2–1.2)
Total Protein: 6.2 g/dL (ref 6.1–8.1)

## 2015-11-25 LAB — LIPID PANEL
CHOL/HDL RATIO: 2.4 ratio (ref <=5.0)
CHOLESTEROL: 130 mg/dL (ref 125–200)
HDL: 55 mg/dL (ref >=46)
LDL Cholesterol: 54 mg/dL (ref <130)
Triglycerides: 107 mg/dL (ref <150)
VLDL: 21 mg/dL (ref <30)

## 2015-11-28 ENCOUNTER — Telehealth (HOSPITAL_COMMUNITY): Payer: Self-pay

## 2015-11-29 ENCOUNTER — Encounter: Payer: Self-pay | Admitting: *Deleted

## 2015-11-30 ENCOUNTER — Ambulatory Visit (HOSPITAL_COMMUNITY)
Admission: RE | Admit: 2015-11-30 | Discharge: 2015-11-30 | Disposition: A | Discharge status: Home / Self Care | Payer: Medicare Other | Source: Ambulatory Visit | Attending: Cardiology | Admitting: Cardiology

## 2015-11-30 DIAGNOSIS — I10 Essential (primary) hypertension: Secondary | ICD-10-CM | POA: Diagnosis not present

## 2015-11-30 DIAGNOSIS — E785 Hyperlipidemia, unspecified: Secondary | ICD-10-CM | POA: Insufficient documentation

## 2015-11-30 DIAGNOSIS — I2510 Atherosclerotic heart disease of native coronary artery without angina pectoris: Secondary | ICD-10-CM | POA: Insufficient documentation

## 2015-11-30 DIAGNOSIS — I252 Old myocardial infarction: Secondary | ICD-10-CM | POA: Diagnosis not present

## 2015-11-30 DIAGNOSIS — R079 Chest pain, unspecified: Secondary | ICD-10-CM

## 2015-11-30 DIAGNOSIS — Z955 Presence of coronary angioplasty implant and graft: Secondary | ICD-10-CM | POA: Insufficient documentation

## 2015-11-30 LAB — MYOCARDIAL PERFUSION IMAGING
CHL CUP NUCLEAR SDS: 2
CHL CUP NUCLEAR SRS: 2
CHL CUP NUCLEAR SSS: 4
CHL CUP RESTING HR STRESS: 60 {beats}/min
CSEPPHR: 96 {beats}/min
LV dias vol: 50 mL (ref 46–106)
LV sys vol: 15 mL
NUC STRESS TID: 0.95

## 2015-11-30 MED ORDER — REGADENOSON 0.4 MG/5ML IV SOLN
0.4000 mg | Freq: Once | INTRAVENOUS | Status: AC
  Administered 2015-11-30: 0.4 mg via INTRAVENOUS

## 2015-11-30 MED ORDER — TECHNETIUM TC 99M TETROFOSMIN IV KIT
10.8000 | PACK | Freq: Once | INTRAVENOUS | Status: AC | PRN
  Administered 2015-11-30: 10.8 via INTRAVENOUS
  Filled 2015-11-30: qty 11

## 2015-11-30 MED ORDER — TECHNETIUM TC 99M TETROFOSMIN IV KIT
27.8000 | PACK | Freq: Once | INTRAVENOUS | Status: AC | PRN
  Administered 2015-11-30: 27.8 via INTRAVENOUS
  Filled 2015-11-30: qty 28

## 2015-11-30 MED ORDER — AMINOPHYLLINE 25 MG/ML IV SOLN
100.0000 mg | Freq: Once | INTRAVENOUS | Status: AC
  Administered 2015-11-30: 100 mg via INTRAVENOUS

## 2015-12-04 DIAGNOSIS — H9193 Unspecified hearing loss, bilateral: Secondary | ICD-10-CM | POA: Diagnosis not present

## 2015-12-04 DIAGNOSIS — Z7982 Long term (current) use of aspirin: Secondary | ICD-10-CM | POA: Diagnosis not present

## 2015-12-04 DIAGNOSIS — H538 Other visual disturbances: Secondary | ICD-10-CM | POA: Diagnosis not present

## 2015-12-04 DIAGNOSIS — J45909 Unspecified asthma, uncomplicated: Secondary | ICD-10-CM | POA: Diagnosis not present

## 2015-12-04 DIAGNOSIS — E784 Other hyperlipidemia: Secondary | ICD-10-CM | POA: Diagnosis not present

## 2015-12-04 DIAGNOSIS — I10 Essential (primary) hypertension: Secondary | ICD-10-CM | POA: Diagnosis not present

## 2015-12-04 DIAGNOSIS — I2510 Atherosclerotic heart disease of native coronary artery without angina pectoris: Secondary | ICD-10-CM | POA: Diagnosis not present

## 2015-12-04 DIAGNOSIS — G8929 Other chronic pain: Secondary | ICD-10-CM | POA: Diagnosis not present

## 2015-12-04 DIAGNOSIS — R2689 Other abnormalities of gait and mobility: Secondary | ICD-10-CM | POA: Diagnosis not present

## 2015-12-04 DIAGNOSIS — J45998 Other asthma: Secondary | ICD-10-CM | POA: Diagnosis not present

## 2015-12-04 DIAGNOSIS — E785 Hyperlipidemia, unspecified: Secondary | ICD-10-CM | POA: Diagnosis not present

## 2015-12-04 DIAGNOSIS — M545 Low back pain: Secondary | ICD-10-CM | POA: Diagnosis not present

## 2015-12-04 DIAGNOSIS — I951 Orthostatic hypotension: Secondary | ICD-10-CM | POA: Diagnosis not present

## 2015-12-04 DIAGNOSIS — K219 Gastro-esophageal reflux disease without esophagitis: Secondary | ICD-10-CM | POA: Diagnosis not present

## 2015-12-05 ENCOUNTER — Encounter (HOSPITAL_COMMUNITY): Payer: Medicare Other

## 2015-12-07 DIAGNOSIS — G8929 Other chronic pain: Secondary | ICD-10-CM | POA: Diagnosis not present

## 2015-12-07 DIAGNOSIS — H9193 Unspecified hearing loss, bilateral: Secondary | ICD-10-CM | POA: Diagnosis not present

## 2015-12-07 DIAGNOSIS — R2689 Other abnormalities of gait and mobility: Secondary | ICD-10-CM | POA: Diagnosis not present

## 2015-12-07 DIAGNOSIS — I951 Orthostatic hypotension: Secondary | ICD-10-CM | POA: Diagnosis not present

## 2015-12-07 DIAGNOSIS — I2510 Atherosclerotic heart disease of native coronary artery without angina pectoris: Secondary | ICD-10-CM | POA: Diagnosis not present

## 2015-12-07 DIAGNOSIS — M545 Low back pain: Secondary | ICD-10-CM | POA: Diagnosis not present

## 2015-12-11 DIAGNOSIS — I951 Orthostatic hypotension: Secondary | ICD-10-CM | POA: Diagnosis not present

## 2015-12-11 DIAGNOSIS — H9193 Unspecified hearing loss, bilateral: Secondary | ICD-10-CM | POA: Diagnosis not present

## 2015-12-11 DIAGNOSIS — G8929 Other chronic pain: Secondary | ICD-10-CM | POA: Diagnosis not present

## 2015-12-11 DIAGNOSIS — R2689 Other abnormalities of gait and mobility: Secondary | ICD-10-CM | POA: Diagnosis not present

## 2015-12-11 DIAGNOSIS — I2510 Atherosclerotic heart disease of native coronary artery without angina pectoris: Secondary | ICD-10-CM | POA: Diagnosis not present

## 2015-12-11 DIAGNOSIS — M545 Low back pain: Secondary | ICD-10-CM | POA: Diagnosis not present

## 2015-12-13 DIAGNOSIS — I2510 Atherosclerotic heart disease of native coronary artery without angina pectoris: Secondary | ICD-10-CM | POA: Diagnosis not present

## 2015-12-13 DIAGNOSIS — G8929 Other chronic pain: Secondary | ICD-10-CM | POA: Diagnosis not present

## 2015-12-13 DIAGNOSIS — I951 Orthostatic hypotension: Secondary | ICD-10-CM | POA: Diagnosis not present

## 2015-12-13 DIAGNOSIS — H9193 Unspecified hearing loss, bilateral: Secondary | ICD-10-CM | POA: Diagnosis not present

## 2015-12-13 DIAGNOSIS — M545 Low back pain: Secondary | ICD-10-CM | POA: Diagnosis not present

## 2015-12-13 DIAGNOSIS — R2689 Other abnormalities of gait and mobility: Secondary | ICD-10-CM | POA: Diagnosis not present

## 2015-12-18 DIAGNOSIS — H9193 Unspecified hearing loss, bilateral: Secondary | ICD-10-CM | POA: Diagnosis not present

## 2015-12-18 DIAGNOSIS — I951 Orthostatic hypotension: Secondary | ICD-10-CM | POA: Diagnosis not present

## 2015-12-18 DIAGNOSIS — M545 Low back pain: Secondary | ICD-10-CM | POA: Diagnosis not present

## 2015-12-18 DIAGNOSIS — G8929 Other chronic pain: Secondary | ICD-10-CM | POA: Diagnosis not present

## 2015-12-18 DIAGNOSIS — R2689 Other abnormalities of gait and mobility: Secondary | ICD-10-CM | POA: Diagnosis not present

## 2015-12-18 DIAGNOSIS — I2510 Atherosclerotic heart disease of native coronary artery without angina pectoris: Secondary | ICD-10-CM | POA: Diagnosis not present

## 2015-12-21 DIAGNOSIS — I2510 Atherosclerotic heart disease of native coronary artery without angina pectoris: Secondary | ICD-10-CM | POA: Diagnosis not present

## 2015-12-21 DIAGNOSIS — M545 Low back pain: Secondary | ICD-10-CM | POA: Diagnosis not present

## 2015-12-21 DIAGNOSIS — I951 Orthostatic hypotension: Secondary | ICD-10-CM | POA: Diagnosis not present

## 2015-12-21 DIAGNOSIS — R2689 Other abnormalities of gait and mobility: Secondary | ICD-10-CM | POA: Diagnosis not present

## 2015-12-21 DIAGNOSIS — H9193 Unspecified hearing loss, bilateral: Secondary | ICD-10-CM | POA: Diagnosis not present

## 2015-12-21 DIAGNOSIS — G8929 Other chronic pain: Secondary | ICD-10-CM | POA: Diagnosis not present

## 2015-12-27 DIAGNOSIS — I2510 Atherosclerotic heart disease of native coronary artery without angina pectoris: Secondary | ICD-10-CM | POA: Diagnosis not present

## 2015-12-27 DIAGNOSIS — G8929 Other chronic pain: Secondary | ICD-10-CM | POA: Diagnosis not present

## 2015-12-27 DIAGNOSIS — R2689 Other abnormalities of gait and mobility: Secondary | ICD-10-CM | POA: Diagnosis not present

## 2015-12-27 DIAGNOSIS — I951 Orthostatic hypotension: Secondary | ICD-10-CM | POA: Diagnosis not present

## 2015-12-27 DIAGNOSIS — M545 Low back pain: Secondary | ICD-10-CM | POA: Diagnosis not present

## 2015-12-27 DIAGNOSIS — H9193 Unspecified hearing loss, bilateral: Secondary | ICD-10-CM | POA: Diagnosis not present

## 2015-12-29 DIAGNOSIS — I951 Orthostatic hypotension: Secondary | ICD-10-CM | POA: Diagnosis not present

## 2015-12-29 DIAGNOSIS — I2510 Atherosclerotic heart disease of native coronary artery without angina pectoris: Secondary | ICD-10-CM | POA: Diagnosis not present

## 2015-12-29 DIAGNOSIS — H9193 Unspecified hearing loss, bilateral: Secondary | ICD-10-CM | POA: Diagnosis not present

## 2015-12-29 DIAGNOSIS — M545 Low back pain: Secondary | ICD-10-CM | POA: Diagnosis not present

## 2015-12-29 DIAGNOSIS — G8929 Other chronic pain: Secondary | ICD-10-CM | POA: Diagnosis not present

## 2015-12-29 DIAGNOSIS — R2689 Other abnormalities of gait and mobility: Secondary | ICD-10-CM | POA: Diagnosis not present

## 2016-01-01 DIAGNOSIS — I2510 Atherosclerotic heart disease of native coronary artery without angina pectoris: Secondary | ICD-10-CM | POA: Diagnosis not present

## 2016-01-01 DIAGNOSIS — H9193 Unspecified hearing loss, bilateral: Secondary | ICD-10-CM | POA: Diagnosis not present

## 2016-01-01 DIAGNOSIS — G8929 Other chronic pain: Secondary | ICD-10-CM | POA: Diagnosis not present

## 2016-01-01 DIAGNOSIS — I951 Orthostatic hypotension: Secondary | ICD-10-CM | POA: Diagnosis not present

## 2016-01-01 DIAGNOSIS — M545 Low back pain: Secondary | ICD-10-CM | POA: Diagnosis not present

## 2016-01-01 DIAGNOSIS — R2689 Other abnormalities of gait and mobility: Secondary | ICD-10-CM | POA: Diagnosis not present

## 2016-01-02 ENCOUNTER — Encounter: Payer: Self-pay | Admitting: Cardiovascular Disease

## 2016-01-02 ENCOUNTER — Ambulatory Visit (INDEPENDENT_AMBULATORY_CARE_PROVIDER_SITE_OTHER): Payer: Medicare Other | Admitting: Cardiovascular Disease

## 2016-01-02 DIAGNOSIS — I2510 Atherosclerotic heart disease of native coronary artery without angina pectoris: Secondary | ICD-10-CM

## 2016-01-02 NOTE — Assessment & Plan Note (Signed)
Ms. Jo Johnson returns today for follow-up. I just recently saw her on 10/31/15. She was stable at that time. She was seen in the emergency room by Dr. Patria Maneampos on 11/21/15 for chest pain. Her workup there was unrevealing. I had stopped her Brilenta. She [facility] 11[facility]. He ordered a Myoview stress test 11/9 which was low risk with a normal EF. Her medicines were adjusted. She's had no recurrent symptoms.

## 2016-01-02 NOTE — Progress Notes (Signed)
01/02/2016 Jo Shadoweggy C Mezera   July 19, 1924  [id number]  Primary Physician Sheryn Bisonavid Patterson, MD Primary Cardiologist: Runell GessJonathan J Johnthomas Lader MD Nicholes CalamityFACP, FACC, FAHA, [place]  HPI:  Jo Johnson is a 82 year old  Female who is accompanied by her son today. I last saw her in the office 10/31/15. She has a history of hypertension and acid reflux, who [facility] with a complaint of chest pain with radiation to both upper extremities and associated dizziness. Her EKG was normal and without acute changes. Her initial troponin was negative. However, she was admitted for obsevation and for MI rule-out. The initial plan was to have the patient undergo nuclear stress testing if serial troponins were negative. However, she ruled in for ACS/NSTEMI with mildly elevated troponin levels, and the decision was made to convert from non-invasive Cardiolite stress test to cardiac catheterization. Her troponin peaked at 0.47. The procedure was performed by Dr. Herbie BaltimoreHarding, via the right radial artery. The catheterization demonstrated severe 2 vessel disease. The cultprit vessel was a 85-90% proximal stenosis of the Circumflex artery. This was successfully treated with PCI with a DES. She was also noted to have a distal OM that was 90% stenosed, along with extensive moderate to severe proximal to mid RCA disease. She had only moderate LAD disease. She had normal LV function with normal LVEDP. The plan was to treat the diffuse RCA lesions medically. She left the cath lab in stable condition and was started on dual antiplatelet therapy with ASA and Brilinta. A beta blocker and a stain were initiated. She was assessed by cardiac rehab.  She presents today for followup to her hospitalization and MI. According to her son, who has been staying with her, she does have some chronic shortness of breath which has had for years but has had not had any worsening symptoms. She and her son also reported that she had some chest discomfort  with left arm pain which he noticed after waking up and may nap when she was lying on her side on the couch. The pain resolved after moving her arm and adjusting her body position. She's also gets tired while doing the clothes washing.  Since I saw her in the office several months ago she was seen in the emergency room on 11/21/15 by Dr. Patria Maneampos for chest pain. Her workup was unrevealing. [facility] who ordered a Myoview stress test on 11/ 9 which was normal. Medications were adjusted. She is feeling clinically improved and has had no recurrent chest pain.  Current Outpatient Prescriptions  Medication Sig Dispense Refill  . acetaminophen (TYLENOL) 500 MG tablet Take 500-1,000 mg by mouth every 6 (six) hours as needed for mild pain.    Marland Kitchen. aspirin EC 81 MG EC tablet Take 1 tablet (81 mg total) by mouth daily.    Marland Kitchen. atorvastatin (LIPITOR) 10 MG tablet TAKE 1 TABLET BY MOUTH DAILY AT 6 PM 30 tablet 5  . B Complex Vitamins (B COMPLEX PO) Take 1 tablet by mouth daily.    . Calcium Carbonate (CALCIUM 600 PO) Take 1 tablet by mouth daily.    . Cholecalciferol (VITAMIN D-3) 1000 units CAPS Take 1 capsule by mouth daily.    . isosorbide mononitrate (IMDUR) 30 MG 24 hr tablet Take 0.5 tablets (15 mg total) by mouth daily. 45 tablet 1  . losartan (COZAAR) 25 MG tablet Take 1 tablet (25 mg total) by mouth daily. 90 tablet 1  .  metoprolol tartrate (LOPRESSOR) 25 MG tablet TAKE 1/2 TABLET BY MOUTH TWICE DAILY 90 tablet 1  . Multiple Vitamins-Minerals (VISION FORMULA EYE HEALTH PO) Take 1 tablet by mouth daily.    . nitroGLYCERIN (NITROSTAT) 0.4 MG SL tablet Place 1 tablet (0.4 mg total) under the tongue every 5 (five) minutes x 3 doses as needed for chest pain. 25 tablet 2  . pantoprazole (PROTONIX) 40 MG tablet TAKE 1 TABLET BY MOUTH EVERY DAY 30 tablet 3   No current facility-administered medications for this visit.     No Known Allergies  Social History   Social History  . Marital  status: Divorced    Spouse name: N/A  . Number of children: N/A  . Years of education: N/A   Occupational History  . Not on file.   Social History Main Topics  . Smoking status: Never Smoker  . Smokeless tobacco: Current User    Types: Chew  . Alcohol use No  . Drug use: No  . Sexual activity: No   Other Topics Concern  . Not on file   Social History Narrative  . No narrative on file     Review of Systems: General: negative for chills, fever, night sweats or weight changes.  Cardiovascular: negative for chest pain, dyspnea on exertion, edema, orthopnea, palpitations, paroxysmal nocturnal dyspnea or shortness of breath Dermatological: negative for rash Respiratory: negative for cough or wheezing Urologic: negative for hematuria Abdominal: negative for nausea, vomiting, diarrhea, bright red blood per rectum, melena, or hematemesis Neurologic: negative for visual changes, syncope, or dizziness All other systems reviewed and are otherwise negative except as noted above.    Blood pressure 138/60, pulse 74, height 5\' 3"  (1.6 m), weight 120 lb (54.4 kg).  General appearance: alert and no distress Neck: no adenopathy, no carotid bruit, no JVD, supple, symmetrical, trachea midline and thyroid not enlarged, symmetric, no tenderness/mass/nodules Lungs: clear to auscultation bilaterally Heart: regular rate and rhythm, S1, S2 normal, no murmur, click, rub or gallop Extremities: extremities normal, atraumatic, no cyanosis or edema  EKG not performed today  ASSESSMENT AND PLAN:   CAD (coronary artery disease) Ms. Halina AndreasKeating returns today for follow-up. I just recently saw her on 10/31/15. She was stable at that time. She was seen in the emergency room by Dr. Patria Maneampos on 11/21/15 for chest pain. Her workup there was unrevealing. I had stopped her Brilenta. [facility]. He ordered a Myoview stress test 11/9 which was low risk with a normal EF. Her medicines were adjusted.  She's had no recurrent symptoms.      Runell GessJonathan J. Akai Dollard MD FACP,FACC,FAHA, [facility] [place] [facility] 01/02/2016 2:41 PM

## 2016-01-02 NOTE — Patient Instructions (Signed)
Medication Instructions: Your physician recommends that you continue on your current medications as directed. Please refer to the Current Medication list given to you today.  Follow-Up: We request that you follow-up in: 3 months with Hao Meng, PA and in 6 months with Dr Berry  You will receive a reminder letter in the mail two months in advance. If you don't receive a letter, please call our office to schedule the follow-up appointment.  If you need a refill on your cardiac medications before your next appointment, please call your pharmacy.  

## 2016-01-03 DIAGNOSIS — R2689 Other abnormalities of gait and mobility: Secondary | ICD-10-CM | POA: Diagnosis not present

## 2016-01-03 DIAGNOSIS — I951 Orthostatic hypotension: Secondary | ICD-10-CM | POA: Diagnosis not present

## 2016-01-03 DIAGNOSIS — G8929 Other chronic pain: Secondary | ICD-10-CM | POA: Diagnosis not present

## 2016-01-03 DIAGNOSIS — H9193 Unspecified hearing loss, bilateral: Secondary | ICD-10-CM | POA: Diagnosis not present

## 2016-01-03 DIAGNOSIS — M545 Low back pain: Secondary | ICD-10-CM | POA: Diagnosis not present

## 2016-01-03 DIAGNOSIS — I2510 Atherosclerotic heart disease of native coronary artery without angina pectoris: Secondary | ICD-10-CM | POA: Diagnosis not present

## 2016-02-22 NOTE — Telephone Encounter (Signed)
Close encounter 

## 2016-03-03 ENCOUNTER — Other Ambulatory Visit: Payer: Self-pay | Admitting: Cardiology

## 2016-03-20 ENCOUNTER — Other Ambulatory Visit: Payer: Self-pay | Admitting: Cardiovascular Disease

## 2016-04-03 ENCOUNTER — Ambulatory Visit (INDEPENDENT_AMBULATORY_CARE_PROVIDER_SITE_OTHER): Payer: Medicare Other | Admitting: Physician Assistant

## 2016-04-03 ENCOUNTER — Encounter: Payer: Self-pay | Admitting: Physician Assistant

## 2016-04-03 VITALS — BP 110/58 | HR 59 | Ht 63.0 in | Wt 122.4 lb

## 2016-04-03 DIAGNOSIS — E785 Hyperlipidemia, unspecified: Secondary | ICD-10-CM

## 2016-04-03 DIAGNOSIS — I10 Essential (primary) hypertension: Secondary | ICD-10-CM | POA: Diagnosis not present

## 2016-04-03 DIAGNOSIS — I2510 Atherosclerotic heart disease of native coronary artery without angina pectoris: Secondary | ICD-10-CM | POA: Diagnosis not present

## 2016-04-03 NOTE — Patient Instructions (Addendum)
Medication Instructions:  STOP LOSARTAN 25MG   If you need a refill on your cardiac medications before your next appointment, please call your pharmacy.  Labwork: NONE  Follow-Up: Your physician wants you to follow-up in: 3 MONTHS WITH DR Allyson SabalBERRY.   Special Instructions: MONITOR BLOOD PRESSURE LOG AND CALL CARDIOLOGY IF SBP>140   Thank you for choosing [facility] [place]!!    HAO MENG, PA-C [place], LPN

## 2016-04-03 NOTE — Progress Notes (Signed)
Cardiology Office Note    Date:  04/03/2016   ID:  Jo Johnson, DOB Oct 26, 1924, MRN [id number]  PCP:  Sheryn Bisonavid Patterson, MD  Cardiologist:  Dr. Allyson SabalBerry   Chief Complaint  Patient presents with  . Follow-up    seen for Dr. Allyson SabalBerry, dizziness low bp    History of Present Illness:  Jo Johnson is a 83 y.o. female with PMH of GERD, CAD with PCI circumflex, hypertension and hyperlipidemia. Her last cardiac catheterization on 06/22/2012 showed 20-30% proximal LAD stenosis, 85% proximal left circumflex stenosis treated with 3.0 x 18 mm Promus premiere DES, she also had calcified 50-60% stenosis in RCA and a 90% stenosis in distal OM. She had normal LV function and normal LVEDP. RCA disease was treated medically. Patient was most recently seen on 10/31/2015, and which time she denies any chest discomfort or shortness of breath. Her Brilinta was stopped. She presented back to the emergency room on 11/21/2015 for evaluation of chest pain, her symptom was felt to be atypical. Troponin was negative. EKG showed no ischemic changes. She was discharged from the ED to follow-up with cardiology as outpatient.   I last saw the patient in November 2017, she was complaining of some chest discomfort. She was only able to tell me the symptoms of her original heart attack. Given her advanced age, I was hesitant to recommend any invasive study. I eventually recommended a stress test which was obtained on 11/30/2015, this showed EF 71%, no sign of ischemia, overall low risk study. She was seen back by Dr. Allyson SabalBerry in December 2017, clinically her symptom has improved.   Patient presents today with her son, she denies any recurrent chest discomfort since our last visit. She has been doing very well. Both her daughter-in-law and her son spent a few nights with her. She denies any significant lower extremity edema, orthopnea or paroxysmal nocturnal dyspnea. She did have some recurrent dizziness, however no obvious fall or  syncope.    Past Medical History:  Diagnosis Date  . Acid reflux   . Asthma   . Chronic cough   . Coronary artery disease    2 vessel CAD with normal LV function status post left circumflex stenting with a drug-eluting stent by Dr. Bryan Lemmaavid Harding  . Hyperlipidemia   . Hypertension     Past Surgical History:  Procedure Laterality Date  . APPENDECTOMY    . CHOLECYSTECTOMY    . EYE SURGERY    . LEFT HEART CATHETERIZATION WITH CORONARY ANGIOGRAM N/A 06/22/2012   Procedure: LEFT HEART CATHETERIZATION WITH CORONARY ANGIOGRAM;  Surgeon: Marykay Lexavid W Harding, MD;  [facility];  Service: Cardiovascular;  Laterality: N/A;  . PERCUTANEOUS CORONARY STENT INTERVENTION (PCI-S)  06/22/2012   Procedure: PERCUTANEOUS CORONARY STENT INTERVENTION (PCI-S);  Surgeon: Marykay Lexavid W Harding, MD;  [facility];  Service: Cardiovascular;;  . surgery to legs d/t trauma      Current Medications: Outpatient Medications Prior to Visit  Medication Sig Dispense Refill  . acetaminophen (TYLENOL) 500 MG tablet Take 500-1,000 mg by mouth every 6 (six) hours as needed for mild pain.    Marland Kitchen. aspirin EC 81 MG EC tablet Take 1 tablet (81 mg total) by mouth daily.    Marland Kitchen. atorvastatin (LIPITOR) 10 MG tablet TAKE 1 TABLET BY MOUTH DAILY AT 6 PM 30 tablet 5  . B Complex Vitamins (B COMPLEX PO) Take 1 tablet by mouth daily.    . Calcium Carbonate (CALCIUM 600 PO) Take  1 tablet by mouth daily.    . Cholecalciferol (VITAMIN D-3) 1000 units CAPS Take 1 capsule by mouth daily.    . metoprolol tartrate (LOPRESSOR) 25 MG tablet TAKE 1/2 TABLET BY MOUTH TWICE DAILY 90 tablet 2  . Multiple Vitamins-Minerals (VISION FORMULA EYE HEALTH PO) Take 1 tablet by mouth daily.    . nitroGLYCERIN (NITROSTAT) 0.4 MG SL tablet Place 1 tablet (0.4 mg total) under the tongue every 5 (five) minutes x 3 doses as needed for chest pain. 25 tablet 2  . pantoprazole (PROTONIX) 40 MG tablet TAKE 1 TABLET BY MOUTH EVERY DAY 30 tablet 5  . losartan  (COZAAR) 25 MG tablet Take 1 tablet (25 mg total) by mouth daily. 90 tablet 1  . isosorbide mononitrate (IMDUR) 30 MG 24 hr tablet Take 0.5 tablets (15 mg total) by mouth daily. 45 tablet 1   No facility-administered medications prior to visit.      Allergies:   Patient has no known allergies.   Social History   Social History  . Marital status: Divorced    Spouse name: N/A  . Number of children: N/A  . Years of education: N/A   Social History Main Topics  . Smoking status: Never Smoker  . Smokeless tobacco: Current User    Types: Chew  . Alcohol use No  . Drug use: No  . Sexual activity: No   Other Topics Concern  . None   Social History Narrative  . None     Family History:  The patient's family history includes COPD in her brother; CVA in her mother; Cancer in her father; Dementia in her brother.   ROS:   Please see the history of present illness.    ROS All other systems reviewed and are negative.   PHYSICAL EXAM:   VS:  BP (!) 110/58   Pulse (!) 59   Ht 5\' 3"  (1.6 m)   Wt 122 lb 6.4 oz (55.5 kg)   BMI 21.68 kg/m    GEN: Well nourished, well developed, in no acute distress  HEENT: normal  Neck: no JVD, carotid bruits, or masses Cardiac: RRR; no murmurs, rubs, or gallops,no edema  Respiratory:  clear to auscultation bilaterally, normal work of breathing GI: soft, nontender, nondistended, + BS MS: no deformity or atrophy  Skin: warm and dry, no rash Neuro:  Alert and Oriented x 3, Strength and sensation are intact Psych: euthymic mood, full affect  Wt Readings from Last 3 Encounters:  04/03/16 122 lb 6.4 oz (55.5 kg)  01/02/16 120 lb (54.4 kg)  11/30/15 116 lb (52.6 kg)      Studies/Labs Reviewed:   EKG:  EKG is not ordered today.   Recent Labs: 11/21/2015: BUN 19; Creatinine, Ser 1.00; Hemoglobin 11.5; Platelets 173; Potassium 3.8; Sodium 141 11/24/2015: ALT 14   Lipid Panel    Component Value Date/Time   CHOL 130 11/24/2015 1159   TRIG 107  11/24/2015 1159   HDL 55 11/24/2015 1159   CHOLHDL 2.4 11/24/2015 1159   VLDL 21 11/24/2015 1159   LDLCALC 54 11/24/2015 1159    Additional studies/ records that were reviewed today include:   Myoview 11/30/2015 Study Highlights    The left ventricular ejection fraction is hyperdynamic (>65%).  Nuclear stress EF: 71%.  There was no ST segment deviation noted during stress.  The study is normal.  This is a low risk study.      ASSESSMENT:    1. Coronary artery disease involving  native coronary artery of native heart without angina pectoris   2. Essential hypertension   3. Hyperlipidemia, unspecified hyperlipidemia type      PLAN:  In order of problems listed above:  1. CAD: Continue aspirin, her Brilinta was recently discontinued. No further chest discomfort after recent negative Myoview.  2. Hypertension: She has been having some dizziness lately, her blood pressure is borderline, I would discontinue losartan. Her ejection fraction was normal.  3. Hyperlipidemia: Cholesterol 130, HDL 55, LDL 54, triglyceride 107. Continue on low-dose Lipitor.    Medication Adjustments/Labs and Tests Ordered: Current medicines are reviewed at length with the patient today.  Concerns regarding medicines are outlined above.  Medication changes, Labs and Tests ordered today are listed in the Patient Instructions below. Patient Instructions  Medication Instructions:  STOP LOSARTAN 25MG   If you need a refill on your cardiac medications before your next appointment, please call your pharmacy.  Labwork: NONE  Follow-Up: Your physician wants you to follow-up in: 3 MONTHS WITH DR Allyson Sabal.   Special Instructions: MONITOR BLOOD PRESSURE LOG AND CALL CARDIOLOGY IF SBP>140   Thank you for choosing [facility] at [facility]!!    Alessio Bogan, PA-C [place], LPN     Ramond Dial, [place]  04/03/2016 4:27 PM    [facility] [address] [place], [place], [place]   [address] Phone: [phone]; Fax: [phone]

## 2016-05-04 ENCOUNTER — Other Ambulatory Visit: Payer: Self-pay | Admitting: Physician Assistant

## 2016-05-06 NOTE — Telephone Encounter (Signed)
Will you please review for refill, Thanks! 

## 2016-05-09 DIAGNOSIS — E784 Other hyperlipidemia: Secondary | ICD-10-CM | POA: Diagnosis not present

## 2016-05-09 DIAGNOSIS — I10 Essential (primary) hypertension: Secondary | ICD-10-CM | POA: Diagnosis not present

## 2016-05-09 DIAGNOSIS — R7301 Impaired fasting glucose: Secondary | ICD-10-CM | POA: Diagnosis not present

## 2016-05-16 DIAGNOSIS — I2510 Atherosclerotic heart disease of native coronary artery without angina pectoris: Secondary | ICD-10-CM | POA: Diagnosis not present

## 2016-05-16 DIAGNOSIS — Z0000 Encounter for general adult medical examination without abnormal findings: Secondary | ICD-10-CM | POA: Diagnosis not present

## 2016-05-16 DIAGNOSIS — E784 Other hyperlipidemia: Secondary | ICD-10-CM | POA: Diagnosis not present

## 2016-05-16 DIAGNOSIS — Z1389 Encounter for screening for other disorder: Secondary | ICD-10-CM | POA: Diagnosis not present

## 2016-05-16 DIAGNOSIS — E46 Unspecified protein-calorie malnutrition: Secondary | ICD-10-CM | POA: Diagnosis not present

## 2016-05-16 DIAGNOSIS — J45998 Other asthma: Secondary | ICD-10-CM | POA: Diagnosis not present

## 2016-05-16 DIAGNOSIS — Z6821 Body mass index (BMI) 21.0-21.9, adult: Secondary | ICD-10-CM | POA: Diagnosis not present

## 2016-05-16 DIAGNOSIS — I10 Essential (primary) hypertension: Secondary | ICD-10-CM | POA: Diagnosis not present

## 2016-05-16 DIAGNOSIS — R7301 Impaired fasting glucose: Secondary | ICD-10-CM | POA: Diagnosis not present

## 2016-05-16 DIAGNOSIS — R3121 Asymptomatic microscopic hematuria: Secondary | ICD-10-CM | POA: Diagnosis not present

## 2016-05-16 DIAGNOSIS — R2689 Other abnormalities of gait and mobility: Secondary | ICD-10-CM | POA: Diagnosis not present

## 2016-05-16 DIAGNOSIS — M545 Low back pain: Secondary | ICD-10-CM | POA: Diagnosis not present

## 2016-05-24 DIAGNOSIS — Z1212 Encounter for screening for malignant neoplasm of rectum: Secondary | ICD-10-CM | POA: Diagnosis not present

## 2016-06-19 ENCOUNTER — Telehealth: Payer: Self-pay | Admitting: Cardiovascular Disease

## 2016-06-19 NOTE — Telephone Encounter (Signed)
Spoke with pt son, he said the patient is not as active today and they want to make sure her bp was okay. She is not c/o any dizziness or other symptoms. She did not eat breakfast but did eat a good lunch. Advised as long as she is not getting dizzy with standing her bp is fine. They will continue to monitor and call with any other concerns.

## 2016-06-19 NOTE — Telephone Encounter (Signed)
New Message     Pt c/o BP issue: STAT if pt c/o blurred vision, one-sided weakness or slurred speech  1. What are your last 5 BP readings?  102/49 102/54 128/60   2. Are you having any other symptoms (ex. Dizziness, headache, blurred vision, passed out)? Fatigue,  Chest feels funny  3. What is your BP issue? Is this too low? No chest pain, just lounging around sleeping

## 2016-07-05 ENCOUNTER — Ambulatory Visit (INDEPENDENT_AMBULATORY_CARE_PROVIDER_SITE_OTHER): Payer: Medicare Other | Admitting: Cardiovascular Disease

## 2016-07-05 ENCOUNTER — Encounter: Payer: Self-pay | Admitting: Cardiovascular Disease

## 2016-07-05 DIAGNOSIS — I214 Non-ST elevation (NSTEMI) myocardial infarction: Secondary | ICD-10-CM | POA: Diagnosis not present

## 2016-07-05 DIAGNOSIS — E7800 Pure hypercholesterolemia, unspecified: Secondary | ICD-10-CM

## 2016-07-05 DIAGNOSIS — I10 Essential (primary) hypertension: Secondary | ICD-10-CM | POA: Diagnosis not present

## 2016-07-05 DIAGNOSIS — I2510 Atherosclerotic heart disease of native coronary artery without angina pectoris: Secondary | ICD-10-CM

## 2016-07-05 NOTE — Assessment & Plan Note (Signed)
History of essential hypertension blood pressure measured at 137/70. I reviewed her blood pressure log which reveals similar blood pressures at home. She is on metoprolol. Continue current meds at current dosing

## 2016-07-05 NOTE — Patient Instructions (Signed)
Medication Instructions: Your physician recommends that you continue on your current medications as directed. Please refer to the Current Medication list given to you today.   Follow-Up: We request that you follow-up in: 6 months with Azalee CourseHao Meng, PA-C and in 12 months with Dr San MorelleBerry  You will receive a reminder letter in the mail two months in advance. If you don't receive a letter, please call our office to schedule the follow-up appointment.  If you need a refill on your cardiac medications before your next appointment, please call your pharmacy.

## 2016-07-05 NOTE — Addendum Note (Signed)
Addended by: Chana BodeGREEN, Bessye Stith L on: 07/05/2016 11:20 AM   Modules accepted: Orders

## 2016-07-05 NOTE — Assessment & Plan Note (Signed)
History of CAD status post non-STEMI with subsequent cardiac catheterization performed by Dr. Herbie BaltimoreHarding. The right radial approach revealing two-vessel disease. The culprit vessel was a 90% proximal circumflex stenosis that underwent PCI and drug-eluting stenting. She also had distal obtuse marginal branch stenosis as well as severe proximal and mid RCA disease. Her EF was normal. The plan was to treat the RCA disease medically. She is on a long-acting oral nitrate and denies chest pain. She had a Myoview stress test performed 11/30/15 which was nonischemic.

## 2016-07-05 NOTE — Assessment & Plan Note (Addendum)
History of hyperlipidemia on statin therapy with recent lipid profile performed 11/24/15 related to pressure 1:30, LDL 54 and HDL of 55

## 2016-07-05 NOTE — Progress Notes (Signed)
07/05/2016 Jo Johnson   Mar 08, 1924  [id number]  Primary Physician Mardella Layman, MD Primary Cardiologist: Runell Gess MD Nicholes Calamity, [place]  HPI:  Jo Johnson is a 83 year old Female who is accompanied by her son today. I last saw her in the office 01/02/16. She has a history of hypertension and acid reflux, who presented to [facility] on 06/21/12 with a complaint of chest pain with radiation to both upper extremities and associated dizziness. Her EKG was normal and without acute changes. Her initial troponin was negative. However, she was admitted for obsevation and for MI rule-out. The initial plan was to have the patient undergo nuclear stress testing if serial troponins were negative. However, she ruled in for ACS/NSTEMI with mildly elevated troponin levels, and the decision was made to convert from non-invasive Cardiolite stress test to cardiac catheterization. Her troponin peaked at 0.47. The procedure was performed by Dr. Herbie Baltimore, via the right radial artery. The catheterization demonstrated severe 2 vessel disease. The cultprit vessel was a 85-90% proximal stenosis of the Circumflex artery. This was successfully treated with PCI with a DES. She was also noted to have a distal OM that was 90% stenosed, along with extensive moderate to severe proximal to mid RCA disease. She had only moderate LAD disease. She had normal LV function with normal LVEDP. The plan was to treat the diffuse RCA lesions medically. She left the cath lab in stable condition and was started on dual antiplatelet therapy with ASA and Brilinta. A beta blocker and a stain were initiated. She was assessed by cardiac rehab.  She presents today for followup to her hospitalization and MI. According to her son, who has been staying with her, she does have some chronic shortness of breath which has had for years but has had not had any worsening symptoms. She and her son also reported that she had some chest  discomfort with left arm pain which he noticed after waking up and may nap when she was lying on her side on the couch. The pain resolved after moving her arm and adjusting her body position. She's also gets tired while doing the clothes washing.  Since I saw her in the office several months ago she was seen in the emergency room on 11/21/15 by Dr. Patria Mane for chest pain. Her workup was unrevealing. She saw Azalee Course [facility] on 11/ 3 who ordered a Myoview stress test on 11/30/15 which was normal. Medications were adjusted. She is feeling clinically improved and has had no recurrent chest pain. Since I saw her in the office 6 months ago she's remained clinically stable.   Current Outpatient Prescriptions  Medication Sig Dispense Refill  . acetaminophen (TYLENOL) 500 MG tablet Take 500-1,000 mg by mouth every 6 (six) hours as needed for mild pain.    Marland Kitchen aspirin EC 81 MG EC tablet Take 1 tablet (81 mg total) by mouth daily.    Marland Kitchen atorvastatin (LIPITOR) 10 MG tablet TAKE 1 TABLET BY MOUTH DAILY AT 6 PM 30 tablet 5  . B Complex Vitamins (B COMPLEX PO) Take 1 tablet by mouth daily.    . Calcium Carbonate (CALCIUM 600 PO) Take 1 tablet by mouth daily.    . Cholecalciferol (VITAMIN D-3) 1000 units CAPS Take 1 capsule by mouth daily.    . isosorbide mononitrate (IMDUR) 30 MG 24 hr tablet TAKE 1/2 TABLET BY MOUTH DAILY 45 tablet 0  . metoprolol tartrate (LOPRESSOR) 25 MG tablet TAKE 1/2  TABLET BY MOUTH TWICE DAILY 90 tablet 2  . Multiple Vitamins-Minerals (VISION FORMULA EYE HEALTH PO) Take 1 tablet by mouth daily.    . nitroGLYCERIN (NITROSTAT) 0.4 MG SL tablet Place 1 tablet (0.4 mg total) under the tongue every 5 (five) minutes x 3 doses as needed for chest pain. 25 tablet 2  . pantoprazole (PROTONIX) 40 MG tablet TAKE 1 TABLET BY MOUTH EVERY DAY 30 tablet 5   No current facility-administered medications for this visit.     No Known Allergies  Social History   Social History  . Marital status: Divorced      Spouse name: N/A  . Number of children: N/A  . Years of education: N/A   Occupational History  . Not on file.   Social History Main Topics  . Smoking status: Never Smoker  . Smokeless tobacco: Current User    Types: Chew  . Alcohol use No  . Drug use: No  . Sexual activity: No   Other Topics Concern  . Not on file   Social History Narrative  . No narrative on file     Review of Systems: General: negative for chills, fever, night sweats or weight changes.  Cardiovascular: negative for chest pain, dyspnea on exertion, edema, orthopnea, palpitations, paroxysmal nocturnal dyspnea or shortness of breath Dermatological: negative for rash Respiratory: negative for cough or wheezing Urologic: negative for hematuria Abdominal: negative for nausea, vomiting, diarrhea, bright red blood per rectum, melena, or hematemesis Neurologic: negative for visual changes, syncope, or dizziness All other systems reviewed and are otherwise negative except as noted above.    Blood pressure 137/70, pulse 82, height 5\' 3"  (1.6 m), weight 117 lb 6.4 oz (53.3 kg).  General appearance: alert and no distress Neck: no adenopathy, no carotid bruit, no JVD, supple, symmetrical, trachea midline and thyroid not enlarged, symmetric, no tenderness/mass/nodules Lungs: clear to auscultation bilaterally Heart: regular rate and rhythm, S1, S2 normal, no murmur, click, rub or gallop Extremities: extremities normal, atraumatic, no cyanosis or edema  EKG sinus rhythm at 82 with occasional PVCs and septal Q waves. I personally reviewed this EKG.  ASSESSMENT AND PLAN:   Essential hypertension History of essential hypertension blood pressure measured at 137/70. I reviewed her blood pressure log which reveals similar blood pressures at home. She is on metoprolol. Continue current meds at current dosing  Hyperlipidemia History of hyperlipidemia on statin therapy with recent lipid profile performed 11/24/15 related  to pressure 1:30, LDL 54 and HDL of 55  NSTEMI - S/P PCI + DES to Prox Circumflex History of CAD status post non-STEMI with subsequent cardiac catheterization performed by Dr. Herbie BaltimoreHarding. The right radial approach revealing two-vessel disease. The culprit vessel was a 90% proximal circumflex stenosis that underwent PCI and drug-eluting stenting. She also had distal obtuse marginal branch stenosis as well as severe proximal and mid RCA disease. Her EF was normal. The plan was to treat the RCA disease medically. She is on a long-acting oral nitrate and denies chest pain. She had a Myoview stress test performed 11/30/15 which was nonischemic.      Runell GessJonathan J. Oberia Beaudoin MD FACP,FACC,FAHA, [facility] 07/05/2016 11:04 AM

## 2016-08-03 ENCOUNTER — Other Ambulatory Visit: Payer: Self-pay | Admitting: Cardiovascular Disease

## 2016-08-12 DIAGNOSIS — R0781 Pleurodynia: Secondary | ICD-10-CM | POA: Diagnosis not present

## 2016-08-12 DIAGNOSIS — I10 Essential (primary) hypertension: Secondary | ICD-10-CM | POA: Diagnosis not present

## 2016-08-12 DIAGNOSIS — R269 Unspecified abnormalities of gait and mobility: Secondary | ICD-10-CM | POA: Diagnosis not present

## 2016-08-12 DIAGNOSIS — S2232XA Fracture of one rib, left side, initial encounter for closed fracture: Secondary | ICD-10-CM | POA: Diagnosis not present

## 2016-08-12 DIAGNOSIS — Z6821 Body mass index (BMI) 21.0-21.9, adult: Secondary | ICD-10-CM | POA: Diagnosis not present

## 2016-08-12 DIAGNOSIS — N281 Cyst of kidney, acquired: Secondary | ICD-10-CM | POA: Diagnosis not present

## 2016-09-03 ENCOUNTER — Other Ambulatory Visit: Payer: Self-pay | Admitting: Cardiovascular Disease

## 2016-09-04 DIAGNOSIS — S2232XA Fracture of one rib, left side, initial encounter for closed fracture: Secondary | ICD-10-CM | POA: Diagnosis not present

## 2017-02-24 ENCOUNTER — Ambulatory Visit (INDEPENDENT_AMBULATORY_CARE_PROVIDER_SITE_OTHER): Payer: Medicare Other | Admitting: Physician Assistant

## 2017-02-24 ENCOUNTER — Encounter: Payer: Self-pay | Admitting: Physician Assistant

## 2017-02-24 VITALS — BP 116/62 | HR 83 | Ht 63.0 in | Wt 121.6 lb

## 2017-02-24 DIAGNOSIS — E785 Hyperlipidemia, unspecified: Secondary | ICD-10-CM

## 2017-02-24 DIAGNOSIS — I2510 Atherosclerotic heart disease of native coronary artery without angina pectoris: Secondary | ICD-10-CM | POA: Diagnosis not present

## 2017-02-24 DIAGNOSIS — I10 Essential (primary) hypertension: Secondary | ICD-10-CM | POA: Diagnosis not present

## 2017-02-24 NOTE — Progress Notes (Signed)
Cardiology Office Note    Date:  02/26/2017   ID:  Jo Johnson, DOB [birth year], MRN [id number]  PCP:  Mardella Layman, MD  Cardiologist:  Dr. Allyson Sabal   Chief Complaint  Patient presents with  . Follow-up    seen for Dr. Allyson Sabal.     History of Present Illness:  Jo Johnson is a 84 y.o. female with PMH of GERD, CAD with PCI of LCx, HTN, and HLD. Her last cardiac catheterization on 06/22/2012 showed 20-30% proximal LAD stenosis, 85% proximal left circumflex stenosis treated with 3.0 x 18 mm Promus premiere DES, she also had calcified 50-60% stenosis in RCA and 90% stenosis in distal OM. She had normal LV function and normal LVEDP. RCA disease was treated medically. Patient was most recently seen on 10/31/2015, and which time she denies any chest discomfort or shortness of breath. Her Brilinta was stopped. She presented back to the emergency room on 11/21/2015 for evaluation of chest pain, her symptom was felt to be atypical. Troponin was negative. EKG showed no ischemic changes. She was discharged from the ED to follow-up with cardiology as outpatient.   She had a Myoview on 11/30/2015 which showed EF 71%, no ischemia, overall low risk study.  I last saw the patient in March 2018, she denied any recurrent chest pain at the time.  She was last seen by Dr. Gery Pray in June 2018, we continued to treat RCA medically.  Patient denies any recent chest pain or shortness of breath.  She has no lower extremity edema, orthopnea or PND.  She can follow-up in 6 months.   Past Medical History:  Diagnosis Date  . Acid reflux   . Asthma   . Chronic cough   . Coronary artery disease    2 vessel CAD with normal LV function status post left circumflex stenting with a drug-eluting stent by Dr. Bryan Lemma  . Hyperlipidemia   . Hypertension     Past Surgical History:  Procedure Laterality Date  . APPENDECTOMY    . CHOLECYSTECTOMY    . EYE SURGERY    . LEFT HEART CATHETERIZATION WITH CORONARY  ANGIOGRAM N/A 06/22/2012   Procedure: LEFT HEART CATHETERIZATION WITH CORONARY ANGIOGRAM;  Surgeon: Marykay Lex, MD;  Location: [facility] CATH LAB;  Service: Cardiovascular;  Laterality: N/A;  . PERCUTANEOUS CORONARY STENT INTERVENTION (PCI-S)  06/22/2012   Procedure: PERCUTANEOUS CORONARY STENT INTERVENTION (PCI-S);  Surgeon: Marykay Lex, MD;  Location: [facility] CATH LAB;  Service: Cardiovascular;;  . surgery to legs d/t trauma      Current Medications: Outpatient Medications Prior to Visit  Medication Sig Dispense Refill  . acetaminophen (TYLENOL) 500 MG tablet Take 500-1,000 mg by mouth every 6 (six) hours as needed for mild pain.    Marland Kitchen AMLODIPINE BESYLATE PO Take 5 mg by mouth daily.    Marland Kitchen aspirin EC 81 MG EC tablet Take 1 tablet (81 mg total) by mouth daily.    Marland Kitchen atorvastatin (LIPITOR) 10 MG tablet TAKE 1 TABLET BY MOUTH DAILY AT 6 PM 30 tablet 5  . B Complex Vitamins (B COMPLEX PO) Take 1 tablet by mouth daily.    . Calcium Carbonate (CALCIUM 600 PO) Take 1 tablet by mouth daily.    . Cholecalciferol (VITAMIN D-3) 1000 units CAPS Take 1 capsule by mouth daily.    . isosorbide mononitrate (IMDUR) 30 MG 24 hr tablet TAKE 1/2 TABLET BY MOUTH DAILY 45 tablet 3  . metoprolol tartrate (LOPRESSOR) 25 MG  tablet TAKE 1/2 TABLET BY MOUTH TWICE DAILY 90 tablet 2  . Multiple Vitamins-Minerals (VISION FORMULA EYE HEALTH PO) Take 1 tablet by mouth daily.    . nitroGLYCERIN (NITROSTAT) 0.4 MG SL tablet Place 1 tablet (0.4 mg total) under the tongue every 5 (five) minutes x 3 doses as needed for chest pain. 25 tablet 2  . pantoprazole (PROTONIX) 40 MG tablet TAKE 1 TABLET BY MOUTH EVERY DAY 90 tablet 1   No facility-administered medications prior to visit.      Allergies:   Patient has no known allergies.   Social History   Socioeconomic History  . Marital status: Divorced    Spouse name: None  . Number of children: None  . Years of education: None  . Highest education level: None  Social Needs  .  Financial resource strain: None  . Food insecurity - worry: None  . Food insecurity - inability: None  . Transportation needs - medical: None  . Transportation needs - non-medical: None  Occupational History  . None  Tobacco Use  . Smoking status: Never Smoker  . Smokeless tobacco: Current User    Types: Chew  Substance and Sexual Activity  . Alcohol use: No  . Drug use: No  . Sexual activity: No  Other Topics Concern  . None  Social History Narrative  . None     Family History:  The patient's family history includes COPD in her brother; CVA in her mother; Cancer in her father; Dementia in her brother.   ROS:   Please see the history of present illness.    ROS All other systems reviewed and are negative.   PHYSICAL EXAM:   VS:  BP 116/62   Pulse 83   Ht 5\' 3"  (1.6 m)   Wt 121 lb 9.6 oz (55.2 kg)   BMI 21.54 kg/m    GEN: Well nourished, well developed, in no acute distress  HEENT: normal  Neck: no JVD, carotid bruits, or masses Cardiac: RRR; no murmurs, rubs, or gallops,no edema  Respiratory:  clear to auscultation bilaterally, normal work of breathing GI: soft, nontender, nondistended, + BS MS: no deformity or atrophy  Skin: warm and dry, no rash Neuro:  Alert and Oriented x 3, Strength and sensation are intact Psych: euthymic mood, full affect  Wt Readings from Last 3 Encounters:  02/24/17 121 lb 9.6 oz (55.2 kg)  07/05/16 117 lb 6.4 oz (53.3 kg)  04/03/16 122 lb 6.4 oz (55.5 kg)      Studies/Labs Reviewed:   EKG:  EKG is ordered today.  The ekg ordered today demonstrates normal sinus rhythm without significant ST-T wave changes  Recent Labs: No results found for requested labs within last 8760 hours.   Lipid Panel    Component Value Date/Time   CHOL 130 11/24/2015 1159   TRIG 107 11/24/2015 1159   HDL 55 11/24/2015 1159   CHOLHDL 2.4 11/24/2015 1159   VLDL 21 11/24/2015 1159   LDLCALC 54 11/24/2015 1159    Additional studies/ records that were  reviewed today include:   Myoview 11/30/2015 Study Highlights    The left ventricular ejection fraction is hyperdynamic (>65%).  Nuclear stress EF: 71%.  There was no ST segment deviation noted during stress.  The study is normal.  This is a low risk study.     ASSESSMENT:    1. Coronary artery disease involving native coronary artery of native heart without angina pectoris   2. Essential hypertension  3. Hyperlipidemia, unspecified hyperlipidemia type      PLAN:  In order of problems listed above:  1. CAD: Last PCI in left circumflex 2014.  Negative Myoview in 2017.  She has not had any chest pain.  On aspirin, Lipitor and beta-blocker.  2. Hypertension: Blood pressure well controlled  3. Hyperlipidemia: Continue Lipitor 10 mg daily.  Fasting lipid panel obtained by primary care provider.    Medication Adjustments/Labs and Tests Ordered: Current medicines are reviewed at length with the patient today.  Concerns regarding medicines are outlined above.  Medication changes, Labs and Tests ordered today are listed in the Patient Instructions below. Patient Instructions  Medication Instructions:  Your physician recommends that you continue on your current medications as directed. Please refer to the Current Medication list given to you today.  Labwork: None   Testing/Procedures: None   Follow-Up: Your physician wants you to follow-up in: 6 months with Dr Allyson Sabal. You will receive a reminder letter in the mail two months in advance. If you don't receive a letter, please call our office to schedule the follow-up appointment.  Any Other Special Instructions Will Be Listed Below (If Applicable). If you need a refill on your cardiac medications before your next appointment, please call your pharmacy.     Ramond Dial, [place]  02/26/2017 7:07 AM    [facility] [address] [place], [place], [place]  [address] Phone: [phone]; Fax: [phone]

## 2017-02-24 NOTE — Patient Instructions (Signed)
Medication Instructions:  Your physician recommends that you continue on your current medications as directed. Please refer to the Current Medication list given to you today.  Labwork: None   Testing/Procedures: None   Follow-Up: Your physician wants you to follow-up in: 6 months with Dr Berry. You will receive a reminder letter in the mail two months in advance. If you don't receive a letter, please call our office to schedule the follow-up appointment.  Any Other Special Instructions Will Be Listed Below (If Applicable). If you need a refill on your cardiac medications before your next appointment, please call your pharmacy.  

## 2017-02-26 ENCOUNTER — Encounter: Payer: Self-pay | Admitting: Physician Assistant

## 2017-03-07 ENCOUNTER — Other Ambulatory Visit: Payer: Self-pay | Admitting: Cardiovascular Disease

## 2017-03-10 NOTE — Telephone Encounter (Signed)
REFILL 

## 2017-03-22 ENCOUNTER — Other Ambulatory Visit: Payer: Self-pay | Admitting: Cardiovascular Disease

## 2017-03-24 NOTE — Telephone Encounter (Signed)
Rx(s) sent to pharmacy electronically.  

## 2017-05-05 ENCOUNTER — Emergency Department (HOSPITAL_COMMUNITY): Payer: Medicare Other

## 2017-05-05 ENCOUNTER — Emergency Department (HOSPITAL_COMMUNITY)
Admission: EM | Admit: 2017-05-05 | Discharge: 2017-05-05 | Disposition: A | Discharge status: Home / Self Care | Payer: Medicare Other | Attending: Emergency Medicine | Admitting: Emergency Medicine

## 2017-05-05 ENCOUNTER — Encounter (HOSPITAL_COMMUNITY): Payer: Self-pay | Admitting: Emergency Medicine

## 2017-05-05 DIAGNOSIS — Z79899 Other long term (current) drug therapy: Secondary | ICD-10-CM | POA: Insufficient documentation

## 2017-05-05 DIAGNOSIS — I252 Old myocardial infarction: Secondary | ICD-10-CM | POA: Diagnosis not present

## 2017-05-05 DIAGNOSIS — E785 Hyperlipidemia, unspecified: Secondary | ICD-10-CM | POA: Insufficient documentation

## 2017-05-05 DIAGNOSIS — Z7982 Long term (current) use of aspirin: Secondary | ICD-10-CM | POA: Insufficient documentation

## 2017-05-05 DIAGNOSIS — R079 Chest pain, unspecified: Secondary | ICD-10-CM

## 2017-05-05 DIAGNOSIS — I10 Essential (primary) hypertension: Secondary | ICD-10-CM | POA: Diagnosis not present

## 2017-05-05 DIAGNOSIS — J45909 Unspecified asthma, uncomplicated: Secondary | ICD-10-CM | POA: Insufficient documentation

## 2017-05-05 DIAGNOSIS — I2510 Atherosclerotic heart disease of native coronary artery without angina pectoris: Secondary | ICD-10-CM | POA: Diagnosis not present

## 2017-05-05 DIAGNOSIS — R072 Precordial pain: Secondary | ICD-10-CM | POA: Diagnosis not present

## 2017-05-05 HISTORY — DX: Acute myocardial infarction, unspecified: I21.9

## 2017-05-05 LAB — BASIC METABOLIC PANEL
ANION GAP: 8 (ref 5–15)
BUN: 16 mg/dL (ref 6–20)
CHLORIDE: 106 mmol/L (ref 101–111)
CO2: 28 mmol/L (ref 22–32)
Calcium: 9.1 mg/dL (ref 8.9–10.3)
Creatinine, Ser: 0.83 mg/dL (ref 0.44–1.00)
GFR calc Af Amer: >60 mL/min (ref >60)
GLUCOSE: 106 mg/dL — AB (ref 65–99)
POTASSIUM: 3.8 mmol/L (ref 3.5–5.1)
Sodium: 142 mmol/L (ref 135–145)

## 2017-05-05 LAB — CBC
HEMATOCRIT: 39.6 % (ref 36.0–46.0)
HEMOGLOBIN: 12.6 g/dL (ref 12.0–15.0)
MCH: 31 pg (ref 26.0–34.0)
MCHC: 31.8 g/dL (ref 30.0–36.0)
MCV: 97.5 fL (ref 78.0–100.0)
Platelets: 187 10*3/uL (ref 150–400)
RBC: 4.06 MIL/uL (ref 3.87–5.11)
RDW: 12.9 % (ref 11.5–15.5)
WBC: 5.8 10*3/uL (ref 4.0–10.5)

## 2017-05-05 LAB — I-STAT TROPONIN, ED
TROPONIN I, POC: 0 ng/mL (ref 0.00–0.08)
Troponin i, poc: 0 ng/mL (ref 0.00–0.08)

## 2017-05-05 NOTE — ED Provider Notes (Signed)
[facility] EMERGENCY DEPARTMENT Provider Note   CSN: [id number] Arrival date & time: 05/05/17  0326     History   Chief Complaint Chief Complaint  Patient presents with  . Chest Pain    HPI Jo Johnson is a 84 y.o. female.  Patient is a 84 year old female with past medical history of hypertension, coronary artery disease with stent in 2014.  She presents today with complaints of chest discomfort.  This started this evening while she was watching a basketball game.  She describes tightness in the center of her chest with no associated shortness of breath, nausea, or diaphoresis.  She used one sublingual nitroglycerin and her symptoms completely resolved.  This episode lasted for approximately 15 minutes prior to resolution.  She denies any other complaints.  The history is provided by the patient.  Chest Pain   This is a new problem. Episode onset: 11 PM. The problem occurs constantly. The problem has been resolved. The pain is present in the substernal region. The pain is moderate. The quality of the pain is described as brief and pressure-like. The pain does not radiate. Associated symptoms include dizziness. Pertinent negatives include no diaphoresis, no palpitations and no shortness of breath. She has tried nothing for the symptoms. The treatment provided no relief.    Past Medical History:  Diagnosis Date  . Acid reflux   . Asthma   . Chronic cough   . Coronary artery disease    2 vessel CAD with normal LV function status post left circumflex stenting with a drug-eluting stent by Dr. Bryan Lemma  . Hyperlipidemia   . Hypertension   . Myocardial infarction [facility])     Patient Active Problem List   Diagnosis Date Noted  . Hyperlipidemia 10/16/2012  . CAD (coronary artery disease) 07/01/2012  . NSTEMI - S/P PCI + DES to Prox Circumflex 06/23/2012  . Unstable angina (HCC) 06/21/2012  . INTRINSIC ASTHMA, WITH EXACERBATION 06/22/2008  . INTRINSIC  ASTHMA, UNSPECIFIED 12/25/2007  . Essential hypertension 12/04/2007  . Cough 12/04/2007    Past Surgical History:  Procedure Laterality Date  . APPENDECTOMY    . CHOLECYSTECTOMY    . EYE SURGERY    . LEFT HEART CATHETERIZATION WITH CORONARY ANGIOGRAM N/A 06/22/2012   Procedure: LEFT HEART CATHETERIZATION WITH CORONARY ANGIOGRAM;  Surgeon: Marykay Lex, MD;  Location: [facility] CATH LAB;  Service: Cardiovascular;  Laterality: N/A;  . PERCUTANEOUS CORONARY STENT INTERVENTION (PCI-S)  06/22/2012   Procedure: PERCUTANEOUS CORONARY STENT INTERVENTION (PCI-S);  Surgeon: Marykay Lex, MD;  Location: [facility] CATH LAB;  Service: Cardiovascular;;  . surgery to legs d/t trauma       OB History   None      Home Medications    Prior to Admission medications   Medication Sig Start Date End Date Taking? Authorizing Provider  acetaminophen (TYLENOL) 500 MG tablet Take 500-1,000 mg by mouth every 6 (six) hours as needed for mild pain.    [provider]  AMLODIPINE BESYLATE PO Take 5 mg by mouth daily.    [provider]  aspirin EC 81 MG EC tablet Take 1 tablet (81 mg total) by mouth daily. 06/23/12   Robbie Lis M, PA-C  atorvastatin (LIPITOR) 10 MG tablet TAKE 1 TABLET BY MOUTH DAILY AT 6 PM 12/10/12   Runell Gess, MD  B Complex Vitamins (B COMPLEX PO) Take 1 tablet by mouth daily.    [provider]  Calcium Carbonate (CALCIUM 600  PO) Take 1 tablet by mouth daily.    [provider]  Cholecalciferol (VITAMIN D-3) 1000 units CAPS Take 1 capsule by mouth daily.    [provider]  isosorbide mononitrate (IMDUR) 30 MG 24 hr tablet TAKE 1/2 TABLET BY MOUTH DAILY 08/05/16   Runell GessBerry, Jonathan J, MD  metoprolol tartrate (LOPRESSOR) 25 MG tablet TAKE 1/2 TABLET BY MOUTH TWICE DAILY 03/24/17   Runell GessBerry, Jonathan J, MD  Multiple Vitamins-Minerals (VISION FORMULA EYE HEALTH PO) Take 1 tablet by mouth daily.    [provider]  nitroGLYCERIN (NITROSTAT) 0.4  MG SL tablet Place 1 tablet (0.4 mg total) under the tongue every 5 (five) minutes x 3 doses as needed for chest pain. 03/16/14   Runell GessBerry, Jonathan J, MD  pantoprazole (PROTONIX) 40 MG tablet TAKE 1 TABLET BY MOUTH EVERY DAY 03/10/17   Runell GessBerry, Jonathan J, MD    Family History Family History  Problem Relation Age of Onset  . CVA Mother   . Cancer Father   . Dementia Brother   . COPD Brother     Social History Social History   Tobacco Use  . Smoking status: Never Smoker  . Smokeless tobacco: Current User    Types: Snuff  Substance Use Topics  . Alcohol use: No  . Drug use: No     Allergies   Patient has no known allergies.   Review of Systems Review of Systems  Constitutional: Negative for diaphoresis.  Respiratory: Negative for shortness of breath.   Cardiovascular: Positive for chest pain. Negative for palpitations.  Neurological: Positive for dizziness.  All other systems reviewed and are negative.    Physical Exam Updated Vital Signs BP (!) 170/80   Pulse 79   Temp 98.2 F (36.8 C) (Oral)   Resp 17   Ht 5\' 3"  (1.6 m)   Wt 54.9 kg (121 lb)   SpO2 99%   BMI 21.43 kg/m   Physical Exam  Constitutional: She is oriented to person, place, and time. She appears well-developed and well-nourished. No distress.  HENT:  Head: Normocephalic and atraumatic.  Neck: Normal range of motion. Neck supple.  Cardiovascular: Normal rate and regular rhythm. Exam reveals no gallop and no friction rub.  No murmur heard. Pulmonary/Chest: Effort normal and breath sounds normal. No respiratory distress. She has no wheezes.  Abdominal: Soft. Bowel sounds are normal. She exhibits no distension. There is no tenderness.  Musculoskeletal: Normal range of motion.       Right lower leg: Normal. She exhibits no tenderness and no edema.       Left lower leg: Normal. She exhibits no tenderness and no edema.  Neurological: She is alert and oriented to person, place, and time.  Skin: Skin is  warm and dry. She is not diaphoretic.  Nursing note and vitals reviewed.    ED Treatments / Results  Labs (all labs ordered are listed, but only abnormal results are displayed) Labs Reviewed  BASIC METABOLIC PANEL - Abnormal; Notable for the following components:      Result Value   Glucose, Bld 106 (*)    All other components within normal limits  CBC  I-STAT TROPONIN, ED    EKG None  Radiology Dg Chest 2 View  Result Date: 05/05/2017 CLINICAL DATA:  Upper central chest pain, radiating to the left side of the neck and left arm. Nausea and dizziness. EXAM: CHEST - 2 VIEW COMPARISON:  Chest radiograph performed 11/21/2015 FINDINGS: The lungs are well-aerated. There is  elevation of the right hemidiaphragm. Peribronchial thickening is noted. There is no evidence of pleural effusion or pneumothorax. The heart is normal in size; the mediastinal contour is within normal limits. No acute osseous abnormalities are seen. Clips are noted within the right upper quadrant, reflecting prior cholecystectomy. Loose bodies are noted at the right glenohumeral joint. IMPRESSION: Elevation of the right hemidiaphragm.  Peribronchial thickening. Electronically Signed   By: Roanna Raider M.D.   On: 05/05/2017 04:13    Procedures Procedures (including critical care time)  Medications Ordered in ED Medications - No data to display   Initial Impression / Assessment and Plan / ED Course  I have reviewed the triage vital signs and the nursing notes.  Pertinent labs & imaging results that were available during my care of the patient were reviewed by me and considered in my medical decision making (see chart for details).  Patient presents with chest discomfort.  She has a history of coronary artery disease with stent placed 5 or 6 years ago.  Workup tonight reveals an unchanged EKG and negative troponin x2.  She had an episode of chest discomfort that lasted 15 minutes and was resolved with nitroglycerin  and has had no further issues.  I have discussed the disposition with the patient and the patient's son.  She is adamant that she not stay in the hospital.  She understands that a cardiac etiology has not completely been ruled out and she is comfortable with this.  She does understand her return if her symptoms worsen or change.  Final Clinical Impressions(s) / ED Diagnoses   Final diagnoses:  None    ED Discharge Orders    None       Geoffery Lyons, MD 05/05/17 [phone]

## 2017-05-05 NOTE — ED Triage Notes (Addendum)
Pt reports upper central CP radiating to L neck, L arm. +nausea, + dizziness. Previous MI with stent. Pt reports pain decreasing after 1 NTG at home

## 2017-05-05 NOTE — Discharge Instructions (Addendum)
Follow-up with your primary doctor or cardiologist in the next 3-4 days, and return to the ER if your symptoms significantly worsen or change.

## 2017-05-07 ENCOUNTER — Telehealth: Payer: Self-pay | Admitting: Cardiovascular Disease

## 2017-05-07 NOTE — Telephone Encounter (Signed)
New Message  Pts son states that he took his mom to the hospital due to feeling nauseated, chest pain and an headache and the er ran test but everything came back normal. So he wants to make sure its ok that she waits until June for her follow up with Allyson SabalBerry  Pt c/o of Chest Pain: STAT if CP now or developed within 24 hours  1. Are you having CP right now? no  2. Are you experiencing any other symptoms (ex. SOB, nausea, vomiting, sweating)? Nausea and headache   3. How long have you been experiencing CP?   4. Is your CP continuous or coming and going? Coming and gooing  5. Have you taken Nitroglycerin? yes ?

## 2017-05-07 NOTE — Telephone Encounter (Signed)
Returned call to son (ok per DPR)-states he took patient to the ER for chest pain.   States EKG was stable and the blood work came back negative for anything acute.  Patient was discharged from ED and he wanted to make sure nothing else needed to be done.   He thinks it may be GERD but he is unsure.   He was wondering if she should come in for OV before June.   Advised we would move this up to see Dr. Allyson SabalBerry to confirm no further testing is needed.   Son agrees, advised scheduler will call to schedule follow up.

## 2017-05-16 DIAGNOSIS — E7849 Other hyperlipidemia: Secondary | ICD-10-CM | POA: Diagnosis not present

## 2017-05-16 DIAGNOSIS — R7301 Impaired fasting glucose: Secondary | ICD-10-CM | POA: Diagnosis not present

## 2017-05-16 DIAGNOSIS — I10 Essential (primary) hypertension: Secondary | ICD-10-CM | POA: Diagnosis not present

## 2017-05-21 ENCOUNTER — Encounter: Payer: Self-pay | Admitting: Cardiovascular Disease

## 2017-05-21 ENCOUNTER — Ambulatory Visit (INDEPENDENT_AMBULATORY_CARE_PROVIDER_SITE_OTHER): Payer: Medicare Other | Admitting: Cardiovascular Disease

## 2017-05-21 DIAGNOSIS — I10 Essential (primary) hypertension: Secondary | ICD-10-CM

## 2017-05-21 DIAGNOSIS — I2510 Atherosclerotic heart disease of native coronary artery without angina pectoris: Secondary | ICD-10-CM | POA: Diagnosis not present

## 2017-05-21 DIAGNOSIS — E7800 Pure hypercholesterolemia, unspecified: Secondary | ICD-10-CM

## 2017-05-21 DIAGNOSIS — I214 Non-ST elevation (NSTEMI) myocardial infarction: Secondary | ICD-10-CM | POA: Diagnosis not present

## 2017-05-21 NOTE — Assessment & Plan Note (Signed)
History of essential hypertension blood pressure measures at 130/58. She is on amlodipine and metoprolol. Continue current meds at current dosing

## 2017-05-21 NOTE — Assessment & Plan Note (Signed)
History of CAD status post cardiac catheterization performed by Dr. Herbie Baltimore 06/21/12 with stenting of the proximal circumflex with moderate LAD disease and moderate to severe RCA disease. Her residual CAD was treated medically. She had normal LV function. She gets occasional chest pain and is on an oral nitrate. She had a Myoview performed 11/30/15 which was normal. Her pain mostly when she is recumbent.

## 2017-05-21 NOTE — Progress Notes (Signed)
05/21/2017 SHARLOT STURKEY   September 16, 1924  [id number]  Primary Physician Mardella Layman, MD Primary Cardiologist: Runell Gess MD Nicholes Calamity, [place]  HPI:  Jo Johnson is a 84 y.o.  Female who is accompanied by her son today. I last saw her in the office 07/05/16. She has a history of hypertension and acid reflux, who presented to [facility] on 06/21/12 with a complaint of chest pain with radiation to both upper extremities and associated dizziness. Her EKG was normal and without acute changes. Her initial troponin was negative. However, she was admitted for obsevation and for MI rule-out. The initial plan was to have the patient undergo nuclear stress testing if serial troponins were negative. However, she ruled in for ACS/NSTEMI with mildly elevated troponin levels, and the decision was made to convert from non-invasive Cardiolite stress test to cardiac catheterization. Her troponin peaked at 0.47. The procedure was performed by Dr. Herbie Baltimore, via the right radial artery. The catheterization demonstrated severe 2 vessel disease. The cultprit vessel was a 85-90% proximal stenosis of the Circumflex artery. This was successfully treated with PCI with a DES. She was also noted to have a distal OM that was 90% stenosed, along with extensive moderate to severe proximal to mid RCA disease. She had only moderate LAD disease. She had normal LV function with normal LVEDP. The plan was to treat the diffuse RCA lesions medically. She left the cath lab in stable condition and was started on dual antiplatelet therapy with ASA and Brilinta. A beta blocker and a stain were initiated. She was assessed by cardiac rehab.  She presents today for followup to her hospitalization and MI. According to her son, who has been staying with her, she does have some chronic shortness of breath which has had for years but has had not had any worsening symptoms. She and her son also reported that she had some chest discomfort with  left arm pain which he noticed after waking up and may nap when she was lying on her side on the couch. The pain resolved after moving her arm and adjusting her body position. She's also gets tired while doing the clothes washing.  Since I saw her in the office several months ago she was seen in the emergency room on 11/21/15 by Dr. Patria Mane for chest pain. Her workup was unrevealing. She saw Azalee Course [facility] on 11/3 who ordered a Myoview stress test on 11/30/15 which was normal. Medications were adjusted. She is feeling clinically improved and has had no recurrent chest pain. Since I saw her in the office 12 months ago she's remained clinically stable.she was seen in the ER 05/05/17 with atypical chest pain and ruled out for myocardial infarction. Her pain mostly when she is recumbent suggesting this is related to reflux.     Current Meds  Medication Sig  . acetaminophen (TYLENOL) 500 MG tablet Take 500-1,000 mg by mouth every 6 (six) hours as needed for mild pain.  Marland Kitchen AMLODIPINE BESYLATE PO Take 5 mg by mouth daily.  Marland Kitchen aspirin EC 81 MG EC tablet Take 1 tablet (81 mg total) by mouth daily.  Marland Kitchen atorvastatin (LIPITOR) 10 MG tablet TAKE 1 TABLET BY MOUTH DAILY AT 6 PM  . B Complex Vitamins (B COMPLEX PO) Take 1 tablet by mouth daily.  . Calcium Carbonate (CALCIUM 600 PO) Take 1 tablet by mouth daily.  . Cholecalciferol (VITAMIN D-3) 1000 units CAPS Take 1 capsule by mouth daily.  Marland Kitchen  isosorbide mononitrate (IMDUR) 30 MG 24 hr tablet TAKE 1/2 TABLET BY MOUTH DAILY  . metoprolol tartrate (LOPRESSOR) 25 MG tablet TAKE 1/2 TABLET BY MOUTH TWICE DAILY  . Multiple Vitamins-Minerals (VISION FORMULA EYE HEALTH PO) Take 1 tablet by mouth daily.  . nitroGLYCERIN (NITROSTAT) 0.4 MG SL tablet Place 1 tablet (0.4 mg total) under the tongue every 5 (five) minutes x 3 doses as needed for chest pain.  . pantoprazole (PROTONIX) 40 MG tablet TAKE 1 TABLET BY MOUTH EVERY DAY     No Known Allergies  Social History    Socioeconomic History  . Marital status: Divorced    Spouse name: Not on file  . Number of children: Not on file  . Years of education: Not on file  . Highest education level: Not on file  Occupational History  . Not on file  Social Needs  . Financial resource strain: Not on file  . Food insecurity:    Worry: Not on file    Inability: Not on file  . Transportation needs:    Medical: Not on file    Non-medical: Not on file  Tobacco Use  . Smoking status: Never Smoker  . Smokeless tobacco: Current User    Types: Snuff  Substance and Sexual Activity  . Alcohol use: No  . Drug use: No  . Sexual activity: Never  Lifestyle  . Physical activity:    Days per week: Not on file    Minutes per session: Not on file  . Stress: Not on file  Relationships  . Social connections:    Talks on phone: Not on file    Gets together: Not on file    Attends religious service: Not on file    Active member of club or organization: Not on file    Attends meetings of clubs or organizations: Not on file    Relationship status: Not on file  . Intimate partner violence:    Fear of current or ex partner: Not on file    Emotionally abused: Not on file    Physically abused: Not on file    Forced sexual activity: Not on file  Other Topics Concern  . Not on file  Social History Narrative  . Not on file     Review of Systems: General: negative for chills, fever, night sweats or weight changes.  Cardiovascular: negative for chest pain, dyspnea on exertion, edema, orthopnea, palpitations, paroxysmal nocturnal dyspnea or shortness of breath Dermatological: negative for rash Respiratory: negative for cough or wheezing Urologic: negative for hematuria Abdominal: negative for nausea, vomiting, diarrhea, bright red blood per rectum, melena, or hematemesis Neurologic: negative for visual changes, syncope, or dizziness All other systems reviewed and are otherwise negative except as noted  above.    Blood pressure (!) 130/58, pulse 78, height  (1.6 m), weight 119 lb (54 kg).  General appearance: alert and no distress Neck: no adenopathy, no carotid bruit, no JVD, supple, symmetrical, trachea midline and thyroid not enlarged, symmetric, no tenderness/mass/nodules Lungs: clear to auscultation bilaterally Heart: regular rate and rhythm, S1, S2 normal, no murmur, click, rub or gallop Extremities: extremities normal, atraumatic, no cyanosis or edema Pulses: 2+ and symmetric Skin: Skin color, texture, turgor normal. No rashes or lesions Neurologic: Alert and oriented X 3, normal strength and tone. Normal symmetric reflexes. Normal coordination and gait  EKG Not performed today  ASSESSMENT AND PLAN:   Essential hypertension History of essential hypertension blood pressure measures at 130/58. She is  on amlodipine and metoprolol. Continue current meds at current dosing  NSTEMI - S/P PCI + DES to Prox Circumflex History of CAD status post cardiac catheterization performed by Dr. Herbie Baltimore 06/21/12 with stenting of the proximal circumflex with moderate LAD disease and moderate to severe RCA disease. Her residual CAD was treated medically. She had normal LV function. She gets occasional chest pain and is on an oral nitrate. She had a Myoview performed 11/30/15 which was normal. Her pain mostly when she is recumbent.  Hyperlipidemia History of hyperlipidemia on statin therapy.      Runell Gess MD FACP,FACC,FAHA, [facility] 05/21/2017 12:37 PM

## 2017-05-21 NOTE — Patient Instructions (Signed)
Your physician wants you to follow-up in: 1 year with Dr Berry. You will receive a reminder letter in the mail two months in advance. If you don't receive a letter, please call our office to schedule the follow-up appointment.  

## 2017-05-21 NOTE — Assessment & Plan Note (Signed)
History of hyperlipidemia on statin therapy. 

## 2017-05-23 DIAGNOSIS — E7849 Other hyperlipidemia: Secondary | ICD-10-CM | POA: Diagnosis not present

## 2017-05-23 DIAGNOSIS — M5380 Other specified dorsopathies, site unspecified: Secondary | ICD-10-CM | POA: Diagnosis not present

## 2017-05-23 DIAGNOSIS — K219 Gastro-esophageal reflux disease without esophagitis: Secondary | ICD-10-CM | POA: Diagnosis not present

## 2017-05-23 DIAGNOSIS — I2510 Atherosclerotic heart disease of native coronary artery without angina pectoris: Secondary | ICD-10-CM | POA: Diagnosis not present

## 2017-05-23 DIAGNOSIS — R2689 Other abnormalities of gait and mobility: Secondary | ICD-10-CM | POA: Diagnosis not present

## 2017-05-23 DIAGNOSIS — R7301 Impaired fasting glucose: Secondary | ICD-10-CM | POA: Diagnosis not present

## 2017-05-23 DIAGNOSIS — R05 Cough: Secondary | ICD-10-CM | POA: Diagnosis not present

## 2017-05-23 DIAGNOSIS — Z1389 Encounter for screening for other disorder: Secondary | ICD-10-CM | POA: Diagnosis not present

## 2017-05-23 DIAGNOSIS — Z0000 Encounter for general adult medical examination without abnormal findings: Secondary | ICD-10-CM | POA: Diagnosis not present

## 2017-05-23 DIAGNOSIS — H9193 Unspecified hearing loss, bilateral: Secondary | ICD-10-CM | POA: Diagnosis not present

## 2017-05-23 DIAGNOSIS — Z6821 Body mass index (BMI) 21.0-21.9, adult: Secondary | ICD-10-CM | POA: Diagnosis not present

## 2017-05-23 DIAGNOSIS — I10 Essential (primary) hypertension: Secondary | ICD-10-CM | POA: Diagnosis not present

## 2017-05-29 ENCOUNTER — Telehealth: Payer: Self-pay | Admitting: Cardiovascular Disease

## 2017-05-29 NOTE — Telephone Encounter (Signed)
Did not need this encounter °

## 2017-07-08 ENCOUNTER — Ambulatory Visit: Payer: Medicare Other | Admitting: Cardiovascular Disease

## 2017-07-24 ENCOUNTER — Other Ambulatory Visit: Payer: Self-pay | Admitting: Cardiovascular Disease

## 2017-11-13 DIAGNOSIS — H3540 Unspecified peripheral retinal degeneration: Secondary | ICD-10-CM | POA: Diagnosis not present

## 2018-01-05 ENCOUNTER — Emergency Department (HOSPITAL_COMMUNITY): Payer: Medicare Other

## 2018-01-05 ENCOUNTER — Other Ambulatory Visit: Payer: Self-pay

## 2018-01-05 ENCOUNTER — Encounter (HOSPITAL_COMMUNITY): Payer: Self-pay

## 2018-01-05 ENCOUNTER — Emergency Department (HOSPITAL_COMMUNITY)
Admission: EM | Admit: 2018-01-05 | Discharge: 2018-01-05 | Disposition: A | Discharge status: Home / Self Care | Payer: Medicare Other | Attending: Emergency Medicine | Admitting: Emergency Medicine

## 2018-01-05 DIAGNOSIS — R1033 Periumbilical pain: Secondary | ICD-10-CM | POA: Insufficient documentation

## 2018-01-05 DIAGNOSIS — I10 Essential (primary) hypertension: Secondary | ICD-10-CM | POA: Insufficient documentation

## 2018-01-05 DIAGNOSIS — K469 Unspecified abdominal hernia without obstruction or gangrene: Secondary | ICD-10-CM | POA: Diagnosis not present

## 2018-01-05 DIAGNOSIS — K529 Noninfective gastroenteritis and colitis, unspecified: Secondary | ICD-10-CM

## 2018-01-05 DIAGNOSIS — J45909 Unspecified asthma, uncomplicated: Secondary | ICD-10-CM | POA: Insufficient documentation

## 2018-01-05 DIAGNOSIS — I252 Old myocardial infarction: Secondary | ICD-10-CM | POA: Diagnosis not present

## 2018-01-05 DIAGNOSIS — Z79899 Other long term (current) drug therapy: Secondary | ICD-10-CM | POA: Diagnosis not present

## 2018-01-05 DIAGNOSIS — I2510 Atherosclerotic heart disease of native coronary artery without angina pectoris: Secondary | ICD-10-CM | POA: Diagnosis not present

## 2018-01-05 DIAGNOSIS — R109 Unspecified abdominal pain: Secondary | ICD-10-CM | POA: Diagnosis present

## 2018-01-05 DIAGNOSIS — Z7982 Long term (current) use of aspirin: Secondary | ICD-10-CM | POA: Insufficient documentation

## 2018-01-05 LAB — COMPREHENSIVE METABOLIC PANEL
ALT: 14 U/L (ref 0–44)
ANION GAP: 12 (ref 5–15)
AST: 24 U/L (ref 15–41)
Albumin: 3.8 g/dL (ref 3.5–5.0)
Alkaline Phosphatase: 60 U/L (ref 38–126)
BUN: 31 mg/dL — ABNORMAL HIGH (ref 8–23)
CHLORIDE: 103 mmol/L (ref 98–111)
CO2: 23 mmol/L (ref 22–32)
CREATININE: 1.12 mg/dL — AB (ref 0.44–1.00)
Calcium: 9 mg/dL (ref 8.9–10.3)
GFR, EST AFRICAN AMERICAN: 50 mL/min — AB (ref >60)
GFR, EST NON AFRICAN AMERICAN: 43 mL/min — AB (ref >60)
Glucose, Bld: 148 mg/dL — ABNORMAL HIGH (ref 70–99)
POTASSIUM: 4.3 mmol/L (ref 3.5–5.1)
Sodium: 138 mmol/L (ref 135–145)
Total Bilirubin: 0.8 mg/dL (ref 0.3–1.2)
Total Protein: 6.5 g/dL (ref 6.5–8.1)

## 2018-01-05 LAB — CBC
HEMATOCRIT: 42.1 % (ref 36.0–46.0)
Hemoglobin: 13.3 g/dL (ref 12.0–15.0)
MCH: 30.4 pg (ref 26.0–34.0)
MCHC: 31.6 g/dL (ref 30.0–36.0)
MCV: 96.3 fL (ref 80.0–100.0)
NRBC: 0 % (ref 0.0–0.2)
Platelets: 203 10*3/uL (ref 150–400)
RBC: 4.37 MIL/uL (ref 3.87–5.11)
RDW: 11.9 % (ref 11.5–15.5)
WBC: 10 10*3/uL (ref 4.0–10.5)

## 2018-01-05 LAB — LIPASE, BLOOD: LIPASE: 73 U/L — AB (ref 11–51)

## 2018-01-05 MED ORDER — IOHEXOL 300 MG/ML  SOLN
100.0000 mL | Freq: Once | INTRAMUSCULAR | Status: AC | PRN
  Administered 2018-01-05: 80 mL via INTRAVENOUS

## 2018-01-05 MED ORDER — ONDANSETRON 8 MG PO TBDP: ORAL_TABLET | ORAL | 0 refills | Status: DC

## 2018-01-05 MED ORDER — ONDANSETRON HCL 4 MG/2ML IJ SOLN
4.0000 mg | Freq: Once | INTRAMUSCULAR | Status: AC
  Administered 2018-01-05: 4 mg via INTRAVENOUS
  Filled 2018-01-05: qty 2

## 2018-01-05 MED ORDER — SODIUM CHLORIDE 0.9 % IV BOLUS
500.0000 mL | Freq: Once | INTRAVENOUS | Status: AC
  Administered 2018-01-05: 500 mL via INTRAVENOUS

## 2018-01-05 NOTE — Discharge Instructions (Addendum)
Zofran as prescribed as needed for nausea.  Liquid diet as tolerated for the next 12 hours, then slowly advance to crackers, toast, and bland foods.  Return to the emergency department if you develop severe abdominal pain, bloody stool, high fever, or other new and concerning symptoms.

## 2018-01-05 NOTE — ED Notes (Signed)
Patient verbalizes understanding of discharge instructions. Opportunity for questioning and answers were provided. Armband removed by staff, pt discharged from ED.  

## 2018-01-05 NOTE — ED Triage Notes (Signed)
Pt son reports pt has had diarrhea and emesis this morning. Pt unable to keep anything down. Pt reports sharp abd pain last night but denies any today. Hx of GERD.

## 2018-01-05 NOTE — ED Provider Notes (Addendum)
[facility] EMERGENCY DEPARTMENT Provider Note   CSN: [id number] Arrival date & time: 01/05/18  1226     History   Chief Complaint Chief Complaint  Patient presents with  . Diarrhea  . Emesis    HPI BEANCA KIESTER is a 84 y.o. female.  Patient is a 84 year old female with past medical history of coronary artery disease with stent, hypertension, chronic cough.  She presents today for evaluation of abdominal pain, nausea, vomiting, diarrhea that started this afternoon.  All has been nonbloody.  She denies any fevers.  She denies any ill contacts.  The son called the doctor's office and they recommend her coming here.  The history is provided by the patient.  Diarrhea   This is a new problem. Episode onset: This morning. The problem occurs continuously. The problem has not changed since onset.The stool consistency is described as watery. There has been no fever. She has tried nothing for the symptoms.    Past Medical History:  Diagnosis Date  . Acid reflux   . Asthma   . Chronic cough   . Coronary artery disease    2 vessel CAD with normal LV function status post left circumflex stenting with a drug-eluting stent by Dr. Bryan Lemma  . Hyperlipidemia   . Hypertension   . Myocardial infarction [facility])     Patient Active Problem List   Diagnosis Date Noted  . Hyperlipidemia 10/16/2012  . CAD (coronary artery disease) 07/01/2012  . NSTEMI - S/P PCI + DES to Prox Circumflex 06/23/2012  . Unstable angina (HCC) 06/21/2012  . INTRINSIC ASTHMA, WITH EXACERBATION 06/22/2008  . INTRINSIC ASTHMA, UNSPECIFIED 12/25/2007  . Essential hypertension 12/04/2007  . Cough 12/04/2007    Past Surgical History:  Procedure Laterality Date  . APPENDECTOMY    . CHOLECYSTECTOMY    . EYE SURGERY    . LEFT HEART CATHETERIZATION WITH CORONARY ANGIOGRAM N/A 06/22/2012   Procedure: LEFT HEART CATHETERIZATION WITH CORONARY ANGIOGRAM;  Surgeon: Marykay Lex, MD;  Location: [facility]  CATH LAB;  Service: Cardiovascular;  Laterality: N/A;  . PERCUTANEOUS CORONARY STENT INTERVENTION (PCI-S)  06/22/2012   Procedure: PERCUTANEOUS CORONARY STENT INTERVENTION (PCI-S);  Surgeon: Marykay Lex, MD;  Location: [facility] CATH LAB;  Service: Cardiovascular;;  . surgery to legs d/t trauma       OB History   No obstetric history on file.      Home Medications    Prior to Admission medications   Medication Sig Start Date End Date Taking? Authorizing Provider  acetaminophen (TYLENOL) 500 MG tablet Take 500-1,000 mg by mouth every 6 (six) hours as needed for mild pain.    [provider]  AMLODIPINE BESYLATE PO Take 5 mg by mouth daily.    [provider]  aspirin EC 81 MG EC tablet Take 1 tablet (81 mg total) by mouth daily. 06/23/12   Robbie Lis M, PA-C  atorvastatin (LIPITOR) 10 MG tablet TAKE 1 TABLET BY MOUTH DAILY AT 6 PM 12/10/12   Runell Gess, MD  B Complex Vitamins (B COMPLEX PO) Take 1 tablet by mouth daily.    [provider]  Calcium Carbonate (CALCIUM 600 PO) Take 1 tablet by mouth daily.    [provider]  Cholecalciferol (VITAMIN D-3) 1000 units CAPS Take 1 capsule by mouth daily.    [provider]  isosorbide mononitrate (IMDUR) 30 MG 24 hr tablet TAKE 1/2 TABLET BY MOUTH DAILY 07/25/17   Runell Gess, MD  metoprolol tartrate (LOPRESSOR) 25 MG tablet TAKE 1/2 TABLET BY MOUTH TWICE DAILY 03/24/17   Runell GessBerry, Jonathan J, MD  Multiple Vitamins-Minerals (VISION FORMULA EYE HEALTH PO) Take 1 tablet by mouth daily.    [provider]  nitroGLYCERIN (NITROSTAT) 0.4 MG SL tablet Place 1 tablet (0.4 mg total) under the tongue every 5 (five) minutes x 3 doses as needed for chest pain. 03/16/14   Runell GessBerry, Jonathan J, MD  pantoprazole (PROTONIX) 40 MG tablet TAKE 1 TABLET BY MOUTH EVERY DAY 03/10/17   Runell GessBerry, Jonathan J, MD    Family History Family History  Problem Relation Age of Onset  . CVA Mother   . Cancer Father     . Dementia Brother   . COPD Brother     Social History Social History   Tobacco Use  . Smoking status: Never Smoker  . Smokeless tobacco: Current User    Types: Snuff  Substance Use Topics  . Alcohol use: No  . Drug use: No     Allergies   Patient has no known allergies.   Review of Systems Review of Systems  All other systems reviewed and are negative.    Physical Exam Updated Vital Signs BP (!) 117/59 (BP Location: Right Arm)   Pulse 83   Temp 97.6 F (36.4 C) (Oral)   Resp 16   Ht 5\' 4"  (1.626 m)   Wt 54.4 kg   SpO2 95%   BMI 20.60 kg/m   Physical Exam Vitals signs and nursing note reviewed.  Constitutional:      General: She is not in acute distress.    Appearance: She is well-developed. She is not diaphoretic.  HENT:     Head: Normocephalic and atraumatic.  Neck:     Musculoskeletal: Normal range of motion and neck supple.  Cardiovascular:     Rate and Rhythm: Normal rate and regular rhythm.     Heart sounds: No murmur. No friction rub. No gallop.   Pulmonary:     Effort: Pulmonary effort is normal. No respiratory distress.     Breath sounds: Normal breath sounds. No wheezing.  Abdominal:     General: Bowel sounds are normal. There is no distension.     Palpations: Abdomen is soft.     Tenderness: There is abdominal tenderness. There is no guarding.     Comments: There is tenderness to palpation in the periumbilical region.  Musculoskeletal: Normal range of motion.  Skin:    General: Skin is warm and dry.  Neurological:     Mental Status: She is alert and oriented to person, place, and time.      ED Treatments / Results  Labs (all labs ordered are listed, but only abnormal results are displayed) Labs Reviewed  LIPASE, BLOOD  COMPREHENSIVE METABOLIC PANEL  CBC  URINALYSIS, ROUTINE W REFLEX MICROSCOPIC    EKG None  Radiology No results found.  Procedures Procedures (including critical care time)  Medications Ordered in  ED Medications  sodium chloride 0.9 % bolus 500 mL (has no administration in time range)  ondansetron (ZOFRAN) injection 4 mg (has no administration in time range)     Initial Impression / Assessment and Plan / ED Course  I have reviewed the triage vital signs and the nursing notes.  Pertinent labs & imaging results that were available during my care of the patient were reviewed by me and considered in my medical decision making (see chart for details).  Patient presents with nausea, vomiting, and  diarrhea that started this morning.  Her symptoms sound viral in nature, however she is tender to palpation in the periumbilical region.  She will undergo a CT scan to rule out surgical causes of this pain.  This study has been completed and shows only fluid in the ascending colon.  There is no evidence for other acute abnormality.    The patient did experience an episode of arm pain while in radiology, I was informed that she was experiencing chest pain, however the patient denied this.  She tells me that she is having pain in her shoulder and arm and that this is not uncommon for this to happen.  I do not see an indication for cardiac work-up and will discharge her as planned.  Final Clinical Impressions(s) / ED Diagnoses   Final diagnoses:  None    ED Discharge Orders    None       Geoffery Lyons, MD 01/05/18 1627    Geoffery Lyons, MD 01/05/18 1700

## 2018-01-05 NOTE — ED Notes (Signed)
Patient transported to CT 

## 2018-01-05 NOTE — ED Notes (Signed)
Pt unable to provide urine sample at this time 

## 2018-03-08 ENCOUNTER — Other Ambulatory Visit: Payer: Self-pay | Admitting: Cardiovascular Disease

## 2018-03-09 NOTE — Telephone Encounter (Signed)
Rx(s) sent to pharmacy electronically.  

## 2018-04-08 ENCOUNTER — Other Ambulatory Visit: Payer: Self-pay | Admitting: Cardiovascular Disease

## 2018-05-21 DIAGNOSIS — R7301 Impaired fasting glucose: Secondary | ICD-10-CM | POA: Diagnosis not present

## 2018-05-21 DIAGNOSIS — I10 Essential (primary) hypertension: Secondary | ICD-10-CM | POA: Diagnosis not present

## 2018-05-21 DIAGNOSIS — E7849 Other hyperlipidemia: Secondary | ICD-10-CM | POA: Diagnosis not present

## 2018-05-28 DIAGNOSIS — Z1331 Encounter for screening for depression: Secondary | ICD-10-CM | POA: Diagnosis not present

## 2018-05-28 DIAGNOSIS — I10 Essential (primary) hypertension: Secondary | ICD-10-CM | POA: Diagnosis not present

## 2018-05-28 DIAGNOSIS — R05 Cough: Secondary | ICD-10-CM | POA: Diagnosis not present

## 2018-05-28 DIAGNOSIS — E46 Unspecified protein-calorie malnutrition: Secondary | ICD-10-CM | POA: Diagnosis not present

## 2018-05-28 DIAGNOSIS — R443 Hallucinations, unspecified: Secondary | ICD-10-CM | POA: Diagnosis not present

## 2018-05-28 DIAGNOSIS — H9193 Unspecified hearing loss, bilateral: Secondary | ICD-10-CM | POA: Diagnosis not present

## 2018-05-28 DIAGNOSIS — R269 Unspecified abnormalities of gait and mobility: Secondary | ICD-10-CM | POA: Diagnosis not present

## 2018-05-28 DIAGNOSIS — R7301 Impaired fasting glucose: Secondary | ICD-10-CM | POA: Diagnosis not present

## 2018-05-28 DIAGNOSIS — I2510 Atherosclerotic heart disease of native coronary artery without angina pectoris: Secondary | ICD-10-CM | POA: Diagnosis not present

## 2018-05-28 DIAGNOSIS — E785 Hyperlipidemia, unspecified: Secondary | ICD-10-CM | POA: Diagnosis not present

## 2018-05-28 DIAGNOSIS — Z1339 Encounter for screening examination for other mental health and behavioral disorders: Secondary | ICD-10-CM | POA: Diagnosis not present

## 2018-05-28 DIAGNOSIS — G309 Alzheimer's disease, unspecified: Secondary | ICD-10-CM | POA: Diagnosis not present

## 2018-05-28 DIAGNOSIS — Z0000 Encounter for general adult medical examination without abnormal findings: Secondary | ICD-10-CM | POA: Diagnosis not present

## 2018-05-29 ENCOUNTER — Telehealth: Payer: Self-pay | Admitting: Cardiovascular Disease

## 2018-05-29 NOTE — Telephone Encounter (Signed)
Please advise of any medication reactions?  Thank you!

## 2018-05-29 NOTE — Telephone Encounter (Signed)
Robitussin is also ok with cardiac medications.

## 2018-05-29 NOTE — Telephone Encounter (Signed)
New Message:    Pt primary doctor put her on Benzonatate and Risperidone yesterday. Her son wants to know if these are alright for pt to take with her other medicine and her condition?

## 2018-05-29 NOTE — Telephone Encounter (Signed)
Spoke with the pt son Jo Johnson and made him aware of Tresa Endo A, Pharm-Ds response. Ok for the pt to take Robitussin prn as directed. Jo Johnson verbalized understanding and voiced appreciation for the assistance.

## 2018-05-29 NOTE — Telephone Encounter (Signed)
I contact patient and advised of message and he wants to know if it is okay to take Robitussin.  Thanks!

## 2018-05-29 NOTE — Telephone Encounter (Signed)
Risperidone can increase hypotensive effect of blood pressure medications - please have pt monitor for symptoms of hypotension (dizziness, blurred vision, etc.). Otherwise no contraindication with current therapies.

## 2018-06-01 ENCOUNTER — Telehealth: Payer: Self-pay | Admitting: Cardiovascular Disease

## 2018-06-02 ENCOUNTER — Telehealth: Payer: Self-pay

## 2018-06-02 ENCOUNTER — Telehealth (INDEPENDENT_AMBULATORY_CARE_PROVIDER_SITE_OTHER): Payer: Medicare Other | Admitting: Cardiovascular Disease

## 2018-06-02 ENCOUNTER — Encounter: Payer: Self-pay | Admitting: Cardiovascular Disease

## 2018-06-02 DIAGNOSIS — I10 Essential (primary) hypertension: Secondary | ICD-10-CM | POA: Diagnosis not present

## 2018-06-02 DIAGNOSIS — I2510 Atherosclerotic heart disease of native coronary artery without angina pectoris: Secondary | ICD-10-CM | POA: Diagnosis not present

## 2018-06-02 DIAGNOSIS — E782 Mixed hyperlipidemia: Secondary | ICD-10-CM | POA: Diagnosis not present

## 2018-06-02 NOTE — Patient Instructions (Signed)
Medication Instructions:  Your physician recommends that you continue on your current medications as directed. Please refer to the Current Medication list given to you today.  If you need a refill on your cardiac medications before your next appointment, please call your pharmacy.   Lab work: NONE If you have labs (blood work) drawn today and your tests are completely normal, you will receive your results only by: . MyChart Message (if you have MyChart) OR . A paper copy in the mail If you have any lab test that is abnormal or we need to change your treatment, we will call you to review the results.  Testing/Procedures: NONE  Follow-Up: At [facility], you and your health needs are our priority.  As part of our continuing mission to provide you with exceptional heart care, we have created designated Provider Care Teams.  These Care Teams include your primary Cardiologist (physician) and Advanced Practice Providers (APPs -  Physician Assistants and Nurse Practitioners) who all work together to provide you with the care you need, when you need it. . You will need a follow up appointment in 6 months with an APP and in 12 months with Dr. Berry.  Please call our office 2 months in advance to schedule each appointment.  You may see one of the following Advanced Practice Providers on your designated Care Team:   . Luke Kilroy, PA-C . Hao Meng, PA-C . Angela Duke, PA-C . Kathryn Lawrence, DNP . Rhonda Barrett, PA-C . Krista Kroeger, PA-C . Callie Goodrich, PA-C    

## 2018-06-02 NOTE — Telephone Encounter (Signed)
LEFT DETAILED MESSAGE OF AVS INSTRUCTIONS ON PATIENT VOICEMAIL PER DPR. Letter including After Visit Summary and any other necessary documents to be mailed to the patient's address on file.

## 2018-06-02 NOTE — Progress Notes (Signed)
Virtual Visit via Telephone Note   This visit type was conducted due to national recommendations for restrictions regarding the COVID-19 Pandemic (e.g. social distancing) in an effort to limit this patient's exposure and mitigate transmission in our community.  Due to her co-morbid illnesses, this patient is at least at moderate risk for complications without adequate follow up.  This format is felt to be most appropriate for this patient at this time.  The patient did not have access to video technology/had technical difficulties with video requiring transitioning to audio format only (telephone).  All issues noted in this document were discussed and addressed.  No physical exam could be performed with this format.  Please refer to the patient's chart for her  consent to telehealth for [facility].   Date:  06/02/2018   ID:  Jo Johnson, DOB October 04, 1924, MRN [id number]  Patient Location: Home Provider Location: Home  PCP:  Mardella Layman, MD  Cardiologist: Dr. Nanetta Batty Electrophysiologist:  None   Evaluation Performed:  Follow-Up Visit  Chief Complaint: Annual follow-up CAD  History of Present Illness:    Jo Johnson is a 85y.o.  Female who is accompanied by her son today. I last saw her in the office  05/21/2017.  She is at home with her son Jo Johnson who is speaking on her behalf.  She has a history of hypertension and acid reflux, who presented to [facility] on 06/21/12 with a complaint of chest pain with radiation to both upper extremities and associated dizziness. Her EKG was normal and without acute changes. Her initial troponin was negative. However, she was admitted for obsevation and for MI rule-out. The initial plan was to have the patient undergo nuclear stress testing if serial troponins were negative. However, she ruled in for ACS/NSTEMI with mildly elevated troponin levels, and the decision was made to convert from non-invasive Cardiolite stress test to cardiac  catheterization. Her troponin peaked at 0.47. The procedure was performed by Dr. Herbie Baltimore, via the right radial artery. The catheterization demonstrated severe 2 vessel disease. The cultprit vessel was a 85-90% proximal stenosis of the Circumflex artery. This was successfully treated with PCI with a DES. She was also noted to have a distal OM that was 90% stenosed, along with extensive moderate to severe proximal to mid RCA disease. She had only moderate LAD disease. She had normal LV function with normal LVEDP. The plan was to treat the diffuse RCA lesions medically. She left the cath lab in stable condition and was started on dual antiplatelet therapy with ASA and Brilinta. A beta blocker and a stain were initiated. She was assessed by cardiac rehab.  She presents today for followup to her hospitalization and MI. According to her son, who has been staying with her, she does have some chronic shortness of breath which has had for years but has had not had any worsening symptoms. She and her son also reported that she had some chest discomfort with left arm pain which he noticed after waking up and may nap when she was lying on her side on the couch. The pain resolved after moving her arm and adjusting her body position. She's also gets tired while doing the clothes washing.  Since I saw her in the office several months ago she was seen in the emergency room on 11/21/15 by Dr. Patria Mane for chest pain. Her workup was unrevealing. She saw Azalee [facility] on 11/3 who ordered a Myoview stress test on11/9/17which was normal. Medications  were adjusted. She is feeling clinically improved and has had no recurrent chest pain. She was seen in the ER 05/05/17 with atypical chest pain and ruled out for myocardial infarction. Her pain mostly when she is recumbent suggesting this is related to reflux.  Since I saw her a year ago she is remained stable.  She lives at home and has family visiting and taking care of her on a daily  basis including her son and daughter-in-law.  She denies chest pain or shortness of breath.  Her major issue is psychiatric with hallucinations.   The patient does not have symptoms concerning for COVID-19 infection (fever, chills, cough, or new shortness of breath).    Past Medical History:  Diagnosis Date  . Acid reflux   . Asthma   . Chronic cough   . Coronary artery disease    2 vessel CAD with normal LV function status post left circumflex stenting with a drug-eluting stent by Dr. Bryan Lemmaavid Harding  . Hyperlipidemia   . Hypertension   . Myocardial infarction [facility](HCC)    Past Surgical History:  Procedure Laterality Date  . APPENDECTOMY    . CHOLECYSTECTOMY    . EYE SURGERY    . LEFT HEART CATHETERIZATION WITH CORONARY ANGIOGRAM N/A 06/22/2012   Procedure: LEFT HEART CATHETERIZATION WITH CORONARY ANGIOGRAM;  Surgeon: Marykay Lexavid W Harding, MD;  [facility];  Service: Cardiovascular;  Laterality: N/A;  . PERCUTANEOUS CORONARY STENT INTERVENTION (PCI-S)  06/22/2012   Procedure: PERCUTANEOUS CORONARY STENT INTERVENTION (PCI-S);  Surgeon: Marykay Lexavid W Harding, MD;  [facility];  Service: Cardiovascular;;  . surgery to legs d/t trauma       No outpatient medications have been marked as taking for the 06/02/18 encounter (Appointment) with Runell GessBerry, Jonathan J, MD.     Allergies:   Patient has no known allergies.   Social History   Tobacco Use  . Smoking status: Never Smoker  . Smokeless tobacco: Current User    Types: Snuff  Substance Use Topics  . Alcohol use: No  . Drug use: No     Family Hx: The patient's family history includes COPD in her brother; CVA in her mother; Cancer in her father; Dementia in her brother.  ROS:   Please see the history of present illness.     All other systems reviewed and are negative.   Prior CV studies:   The following studies were reviewed today:  None  Labs/Other Tests and Data Reviewed:    EKG:  No ECG reviewed.  Recent Labs:  01/05/2018: ALT 14; BUN 31; Creatinine, Ser 1.12; Hemoglobin 13.3; Platelets 203; Potassium 4.3; Sodium 138   Recent Lipid Panel Lab Results  Component Value Date/Time   CHOL 130 11/24/2015 11:59 AM   TRIG 107 11/24/2015 11:59 AM   HDL 55 11/24/2015 11:59 AM   CHOLHDL 2.4 11/24/2015 11:59 AM   LDLCALC 54 11/24/2015 11:59 AM    Wt Readings from Last 3 Encounters:  01/05/18 120 lb (54.4 kg)  05/21/17 119 lb (54 kg)  05/05/17 121 lb (54.9 kg)     Objective:    Vital Signs:  There were no vitals taken for this visit.   VITAL SIGNS:  reviewed blood pressure measured by her son today was 122/69.  A full physical exam was not performed since this was a virtual telemedicine phone visit  ASSESSMENT & PLAN:    1. Coronary artery disease- history of CAD status post non-STEMI 06/21/2012 treated with PCI of an  obtuse marginal branch by Dr. Herbie Baltimore with medical therapy of the residual CAD.  She denies chest pain or shortness of breath 2. Essential hypertension- history of essential hypertension her blood pressure measured today at home of 122/69 on amlodipine and metoprolol 3. Hyperlipidemia- history of hyperlipidemia on Lipitor with lipid profile performed 05/21/2018 revealing total cholesterol 151, LDL 73 and HDL of 52 4. Psychiatric- history of hallucinations being addressed by her PCP  COVID-19 Education: The signs and symptoms of COVID-19 were discussed with the patient and how to seek care for testing (follow up with PCP or arrange E-visit).  The importance of social distancing was discussed today.  Time:   Today, I have spent 8 minutes with the patient with telehealth technology discussing the above problems.     Medication Adjustments/Labs and Tests Ordered: Current medicines are reviewed at length with the patient today.  Concerns regarding medicines are outlined above.   Tests Ordered: No orders of the defined types were placed in this encounter.   Medication Changes: No  orders of the defined types were placed in this encounter.   Disposition:  Follow up in 6 month(s)  Signed, Nanetta Batty, MD  06/02/2018 8:10 AM    [facility]

## 2018-06-04 ENCOUNTER — Other Ambulatory Visit: Payer: Self-pay | Admitting: Cardiovascular Disease

## 2018-06-11 DIAGNOSIS — G309 Alzheimer's disease, unspecified: Secondary | ICD-10-CM | POA: Diagnosis not present

## 2018-06-11 DIAGNOSIS — R443 Hallucinations, unspecified: Secondary | ICD-10-CM | POA: Diagnosis not present

## 2018-06-22 DIAGNOSIS — E46 Unspecified protein-calorie malnutrition: Secondary | ICD-10-CM | POA: Diagnosis not present

## 2018-06-22 DIAGNOSIS — R443 Hallucinations, unspecified: Secondary | ICD-10-CM | POA: Diagnosis not present

## 2018-06-22 DIAGNOSIS — G309 Alzheimer's disease, unspecified: Secondary | ICD-10-CM | POA: Diagnosis not present

## 2018-06-28 ENCOUNTER — Other Ambulatory Visit: Payer: Self-pay | Admitting: Cardiovascular Disease

## 2018-07-02 ENCOUNTER — Other Ambulatory Visit: Payer: Self-pay

## 2018-07-06 DIAGNOSIS — G309 Alzheimer's disease, unspecified: Secondary | ICD-10-CM | POA: Diagnosis not present

## 2018-07-06 DIAGNOSIS — R443 Hallucinations, unspecified: Secondary | ICD-10-CM | POA: Diagnosis not present

## 2018-07-07 ENCOUNTER — Other Ambulatory Visit: Payer: Self-pay

## 2018-07-07 MED ORDER — ISOSORBIDE MONONITRATE ER 30 MG PO TB24: 15.0000 mg | ORAL_TABLET | Freq: Every day | ORAL | 1 refills | Status: DC

## 2018-07-20 NOTE — Telephone Encounter (Signed)
Opened in error

## 2018-08-06 DIAGNOSIS — I2510 Atherosclerotic heart disease of native coronary artery without angina pectoris: Secondary | ICD-10-CM | POA: Diagnosis not present

## 2018-08-06 DIAGNOSIS — E46 Unspecified protein-calorie malnutrition: Secondary | ICD-10-CM | POA: Diagnosis not present

## 2018-08-06 DIAGNOSIS — G309 Alzheimer's disease, unspecified: Secondary | ICD-10-CM | POA: Diagnosis not present

## 2018-08-06 DIAGNOSIS — R443 Hallucinations, unspecified: Secondary | ICD-10-CM | POA: Diagnosis not present

## 2018-10-13 ENCOUNTER — Emergency Department (HOSPITAL_COMMUNITY)
Admission: EM | Admit: 2018-10-13 | Discharge: 2018-10-13 | Disposition: A | Discharge status: Home / Self Care | Payer: Medicare Other | Attending: Emergency Medicine | Admitting: Emergency Medicine

## 2018-10-13 ENCOUNTER — Emergency Department (HOSPITAL_COMMUNITY): Payer: Medicare Other

## 2018-10-13 ENCOUNTER — Other Ambulatory Visit: Payer: Self-pay

## 2018-10-13 ENCOUNTER — Encounter (HOSPITAL_COMMUNITY): Payer: Self-pay | Admitting: Emergency Medicine

## 2018-10-13 DIAGNOSIS — I259 Chronic ischemic heart disease, unspecified: Secondary | ICD-10-CM | POA: Insufficient documentation

## 2018-10-13 DIAGNOSIS — I10 Essential (primary) hypertension: Secondary | ICD-10-CM | POA: Diagnosis not present

## 2018-10-13 DIAGNOSIS — N390 Urinary tract infection, site not specified: Secondary | ICD-10-CM | POA: Insufficient documentation

## 2018-10-13 DIAGNOSIS — Z7982 Long term (current) use of aspirin: Secondary | ICD-10-CM | POA: Diagnosis not present

## 2018-10-13 DIAGNOSIS — J45909 Unspecified asthma, uncomplicated: Secondary | ICD-10-CM | POA: Diagnosis not present

## 2018-10-13 DIAGNOSIS — Z79899 Other long term (current) drug therapy: Secondary | ICD-10-CM | POA: Insufficient documentation

## 2018-10-13 DIAGNOSIS — R350 Frequency of micturition: Secondary | ICD-10-CM | POA: Insufficient documentation

## 2018-10-13 DIAGNOSIS — R51 Headache: Secondary | ICD-10-CM | POA: Diagnosis not present

## 2018-10-13 DIAGNOSIS — F17290 Nicotine dependence, other tobacco product, uncomplicated: Secondary | ICD-10-CM | POA: Diagnosis not present

## 2018-10-13 DIAGNOSIS — R4182 Altered mental status, unspecified: Secondary | ICD-10-CM | POA: Diagnosis not present

## 2018-10-13 LAB — CBC
HCT: 38.5 % (ref 36.0–46.0)
Hemoglobin: 12.5 g/dL (ref 12.0–15.0)
MCH: 31.9 pg (ref 26.0–34.0)
MCHC: 32.5 g/dL (ref 30.0–36.0)
MCV: 98.2 fL (ref 80.0–100.0)
Platelets: 173 10*3/uL (ref 150–400)
RBC: 3.92 MIL/uL (ref 3.87–5.11)
RDW: 12.6 % (ref 11.5–15.5)
WBC: 4.4 10*3/uL (ref 4.0–10.5)
nRBC: 0 % (ref 0.0–0.2)

## 2018-10-13 LAB — BASIC METABOLIC PANEL
Anion gap: 13 (ref 5–15)
BUN: 21 mg/dL (ref 8–23)
CO2: 24 mmol/L (ref 22–32)
Calcium: 8.9 mg/dL (ref 8.9–10.3)
Chloride: 103 mmol/L (ref 98–111)
Creatinine, Ser: 0.95 mg/dL (ref 0.44–1.00)
GFR calc Af Amer: >60 mL/min (ref >60)
GFR calc non Af Amer: 52 mL/min — ABNORMAL LOW (ref >60)
Glucose, Bld: 134 mg/dL — ABNORMAL HIGH (ref 70–99)
Potassium: 4 mmol/L (ref 3.5–5.1)
Sodium: 140 mmol/L (ref 135–145)

## 2018-10-13 LAB — URINALYSIS, ROUTINE W REFLEX MICROSCOPIC
Bacteria, UA: NONE SEEN
Bilirubin Urine: NEGATIVE
Glucose, UA: NEGATIVE mg/dL
Hgb urine dipstick: NEGATIVE
Ketones, ur: NEGATIVE mg/dL
Nitrite: NEGATIVE
Protein, ur: NEGATIVE mg/dL
Specific Gravity, Urine: 1.014 (ref 1.005–1.030)
pH: 7 (ref 5.0–8.0)

## 2018-10-13 LAB — HEPATIC FUNCTION PANEL
ALT: 14 U/L (ref 0–44)
AST: 25 U/L (ref 15–41)
Albumin: 3.3 g/dL — ABNORMAL LOW (ref 3.5–5.0)
Alkaline Phosphatase: 51 U/L (ref 38–126)
Bilirubin, Direct: 0.1 mg/dL (ref 0.0–0.2)
Indirect Bilirubin: 0.4 mg/dL (ref 0.3–0.9)
Total Bilirubin: 0.5 mg/dL (ref 0.3–1.2)
Total Protein: 6 g/dL — ABNORMAL LOW (ref 6.5–8.1)

## 2018-10-13 MED ORDER — SODIUM CHLORIDE 0.9% FLUSH: 3.0000 mL | Freq: Once | INTRAVENOUS | Status: DC

## 2018-10-13 MED ORDER — FOSFOMYCIN TROMETHAMINE 3 G PO PACK
3.0000 g | PACK | Freq: Once | ORAL | Status: AC
  Administered 2018-10-13: 17:00:00 3 g via ORAL
  Filled 2018-10-13: qty 3

## 2018-10-13 NOTE — ED Provider Notes (Signed)
[facility] EMERGENCY DEPARTMENT Provider Note   CSN: [id number] Arrival date & time: 10/13/18  1040     History   Chief Complaint Chief Complaint  Patient presents with  . Altered Mental Status  . Urinary Frequency    HPI Jo Johnson is a 85 y.o. female.     HPI Patient presents with her son who provides much of the history. Patient has a history of psychiatric disease, is on Risperdal, the level 5 caveat secondary to psychiatric condition.   Patient's son and notes that after worsening psychosis, she started Risperdal, about a month ago. She has been doing generally well, but now over the past 2 days she has had decreased interactivity, increasing delusions, minimal oral intake. Over a slightly longer term, about 1 month patient has had increasing frequency of falls, without any noted deformities, but with different areas of bruising. Today, after speaking with the patient's primary care physician she was sent here for evaluation.  Patient lives at home with her son and his wife, who are the primary caregivers.  Past Medical History:  Diagnosis Date  . Acid reflux   . Asthma   . Chronic cough   . Coronary artery disease    2 vessel CAD with normal LV function status post left circumflex stenting with a drug-eluting stent by Dr. Glenetta Hew  . Hyperlipidemia   . Hypertension   . Myocardial infarction [facility])     Patient Active Problem List   Diagnosis Date Noted  . Hyperlipidemia 10/16/2012  . CAD (coronary artery disease) 07/01/2012  . NSTEMI - S/P PCI + DES to Prox Circumflex 06/23/2012  . Unstable angina ([place]) 06/21/2012  . INTRINSIC ASTHMA, WITH EXACERBATION 06/22/2008  . INTRINSIC ASTHMA, UNSPECIFIED 12/25/2007  . Essential hypertension 12/04/2007  . Cough 12/04/2007    Past Surgical History:  Procedure Laterality Date  . APPENDECTOMY    . CHOLECYSTECTOMY    . EYE SURGERY    . LEFT HEART CATHETERIZATION WITH CORONARY ANGIOGRAM  N/A 06/22/2012   Procedure: LEFT HEART CATHETERIZATION WITH CORONARY ANGIOGRAM;  Surgeon: Leonie Man, MD;  Location[facility] CATH LAB;  Service: Cardiovascular;  Laterality: N/A;  . PERCUTANEOUS CORONARY STENT INTERVENTION (PCI-S)  06/22/2012   Procedure: PERCUTANEOUS CORONARY STENT INTERVENTION (PCI-S);  Surgeon: Leonie Man, MD;  Location: [facility] CATH LAB;  Service: Cardiovascular;;  . surgery to legs d/t trauma       OB History   No obstetric history on file.      Home Medications    Prior to Admission medications   Medication Sig Start Date End Date Taking? Authorizing Provider  acetaminophen (TYLENOL) 500 MG tablet Take 500-1,000 mg by mouth every 6 (six) hours as needed for mild pain.   Yes [provider]  aspirin EC 81 MG EC tablet Take 1 tablet (81 mg total) by mouth daily. 06/23/12  Yes Simmons, Brittainy M, PA-C  atorvastatin (LIPITOR) 10 MG tablet TAKE 1 TABLET BY MOUTH DAILY AT 6 PM Patient taking differently: Take 10 mg by mouth daily.  12/10/12  Yes Lorretta Harp, MD  Calcium Carbonate (CALCIUM 600 PO) Take 1 tablet by mouth daily.   Yes [provider]  CALCIUM-MAGNESIUM PO Take 1 tablet by mouth daily. zinc   Yes [provider]  Cholecalciferol (VITAMIN D-3) 1000 units CAPS Take 1,000 Units by mouth daily.    Yes [provider]  docusate sodium (COLACE) 100 MG capsule Take 100 mg by mouth 2 (  two) times daily.   Yes [provider]  isosorbide mononitrate (IMDUR) 30 MG 24 hr tablet Take 0.5 tablets (15 mg total) by mouth daily. Please keep upcoming appt for future refills. Thank you 07/07/18  Yes Runell GessBerry, Jonathan J, MD  metoprolol tartrate (LOPRESSOR) 25 MG tablet TAKE 1/2 TABLET BY MOUTH TWICE DAILY Patient taking differently: Take 12.5 mg by mouth 2 (two) times daily.  06/04/18  Yes Runell GessBerry, Jonathan J, MD  Multiple Vitamins-Minerals (VISION FORMULA EYE HEALTH PO) Take 1 tablet by mouth daily.   Yes [provider]   nitroGLYCERIN (NITROSTAT) 0.4 MG SL tablet Place 1 tablet (0.4 mg total) under the tongue every 5 (five) minutes x 3 doses as needed for chest pain. 03/16/14  Yes Runell GessBerry, Jonathan J, MD  pantoprazole (PROTONIX) 40 MG tablet TAKE 1 TABLET BY MOUTH EVERY DAY Patient taking differently: Take 40 mg by mouth daily.  03/10/17  Yes Runell GessBerry, Jonathan J, MD  risperiDONE (RISPERDAL M-TABS) 1 MG disintegrating tablet Place 1 mg under the tongue See admin instructions. Take 1 mg at lunch time and 2 mg  In the evening 10/01/18  Yes [provider]  vitamin B-12 (CYANOCOBALAMIN) 250 MCG tablet Take 250 mcg by mouth daily.   Yes [provider]    Family History Family History  Problem Relation Age of Onset  . CVA Mother   . Cancer Father   . Dementia Brother   . COPD Brother     Social History Social History   Tobacco Use  . Smoking status: Never Smoker  . Smokeless tobacco: Current User    Types: Snuff  Substance Use Topics  . Alcohol use: No  . Drug use: No     Allergies   Patient has no known allergies.   Review of Systems Review of Systems  Unable to perform ROS: Psychiatric disorder     Physical Exam Updated Vital Signs BP (!) 151/57 (BP Location: Right Arm)   Pulse 66   Temp 98 F (36.7 C) (Oral)   Resp 19   Wt 53.5 kg   SpO2 97%   BMI 20.25 kg/m   Physical Exam Vitals signs and nursing note reviewed.  Constitutional:      Appearance: She is well-developed. She is ill-appearing.  HENT:     Head: Normocephalic and atraumatic.  Eyes:     Conjunctiva/sclera: Conjunctivae normal.  Cardiovascular:     Rate and Rhythm: Normal rate and regular rhythm.  Pulmonary:     Effort: Pulmonary effort is normal. No respiratory distress.     Breath sounds: Normal breath sounds. No stridor.  Abdominal:     General: There is no distension.  Skin:    General: Skin is warm and dry.  Neurological:     Mental Status: She is alert.     Cranial Nerves: No cranial  nerve deficit.     Motor: Weakness and atrophy present. No tremor.  Psychiatric:        Behavior: Behavior is withdrawn.        Cognition and Memory: Cognition is impaired.      ED Treatments / Results  Labs (all labs ordered are listed, but only abnormal results are displayed) Labs Reviewed  URINALYSIS, ROUTINE W REFLEX MICROSCOPIC - Abnormal; Notable for the following components:      Result Value   APPearance HAZY (*)    Leukocytes,Ua TRACE (*)    All other components within normal limits  BASIC METABOLIC PANEL - Abnormal; Notable  for the following components:   Glucose, Bld 134 (*)    GFR calc non Af Amer 52 (*)    All other components within normal limits  HEPATIC FUNCTION PANEL - Abnormal; Notable for the following components:   Total Protein 6.0 (*)    Albumin 3.3 (*)    All other components within normal limits  CBC  CBG MONITORING, ED    EKG EKG Interpretation  Date/Time:  Tuesday October 13 2018 11:22:19 EDT Ventricular Rate:  86 PR Interval:  166 QRS Duration: 72 QT Interval:  370 QTC Calculation: 442 R Axis:   63 Text Interpretation:  Poor data quality, interpretation may be adversely affected Normal sinus rhythm Baseline wander Otherwise within normal limits Confirmed by Gerhard Munch [phone]) on 10/13/2018 12:18:07 PM   Radiology Ct Head Wo Contrast  Result Date: 10/13/2018 CLINICAL DATA:  Altered mental status.  Headaches. EXAM: CT HEAD WITHOUT CONTRAST TECHNIQUE: Contiguous axial images were obtained from the base of the skull through the vertex without intravenous contrast. COMPARISON:  None. FINDINGS: Brain: Ventricles and cisterns are within normal. Mild prominence of the CSF spaces compatible with age related atrophic change. There is mild chronic ischemic microvascular disease. There is no mass, mass effect, shift of midline structures or acute hemorrhage. No evidence of acute infarction. Vascular: No hyperdense vessel or unexpected calcification.  Skull: Normal. Negative for fracture or focal lesion. Sinuses/Orbits: No acute finding. Other: None. IMPRESSION: No acute findings. Chronic ischemic microvascular disease and age related atrophic change. Electronically Signed   By: Elberta Fortis M.D.   On: 10/13/2018 15:15   Dg Chest Port 1 View  Result Date: 10/13/2018 CLINICAL DATA:  Altered mental status for 2 days. Status post fall x5 over the past month. EXAM: PORTABLE CHEST 1 VIEW COMPARISON:  Single-view of the chest 05/05/2017. FINDINGS: Elevation of the right hemidiaphragm relative to the left is unchanged. Lungs are clear. Heart size is normal. No pneumothorax or pleural fluid. Aortic atherosclerosis again seen. No acute or focal bony abnormality. Degenerative disease of the right shoulder with loose bodies subjacent to the coracoid process noted. IMPRESSION: No acute disease. Atherosclerosis. Electronically Signed   By: Drusilla Kanner M.D.   On: 10/13/2018 13:18    Procedures Procedures (including critical care time)  Medications Ordered in ED Medications  sodium chloride flush (NS) 0.9 % injection 3 mL (has no administration in time range)  fosfomycin (MONUROL) packet 3 g (has no administration in time range)     Initial Impression / Assessment and Plan / ED Course  I have reviewed the triage vital signs and the nursing notes.  Pertinent labs & imaging results that were available during my care of the patient were reviewed by me and considered in my medical decision making (see chart for details).        3:56 PM Patient in no distress, hemodynamically unremarkable.  I discussed all findings with the patient's son. With no hemodynamic instability, largely reassuring labs, CT, x-ray, there is no evidence for pneumonia, bacteremia, sepsis, new intracranial pathology. Some suspicion for urinary tract disease versus progression of her chronic condition. Patient has received fosfomycin, and I discussed her case with her case  management colleagues to provide additional resources to the patient's family.  Final Clinical Impressions(s) / ED Diagnoses   Final diagnoses:  Lower urinary tract infectious disease    ED Discharge Orders         Ordered    Home Health     10/13/18  1555    Face-to-face encounter (required for Medicare/Medicaid patients)    Comments: I Gerhard Munch certify that this patient is under my care and that I, or a nurse practitioner or physician's assistant working with me, had a face-to-face encounter that meets the physician face-to-face encounter requirements with this patient on 10/13/2018. The encounter with the patient was in whole, or in part for the following medical condition(s) which is the primary reason for home health care (List medical condition): additional orders per PMD   10/13/18 1555           Gerhard Munch, MD 10/13/18 1557

## 2018-10-13 NOTE — TOC Transition Note (Signed)
Transition of Care [facility]) - CM/SW Discharge Note   Patient Details  Name: Jo Johnson MRN: [id number] Date of Birth: 08-Jan-1925  Transition of Care [facility]) CM/SW Contact:  Fuller Mandril, RN Phone Number: 10/13/2018, 4:07 PM   Clinical Narrative:     Jason Coop. Clydene Laming, RN, BSN, [employer] [phone] Spoke with pt at bedside regarding discharge planning for [name]. Offered pt list of home health agencies to choose from.  Pt chose [facility] to render services. Burr Medico of [facility] notified. Patient made aware that [facility] will be in contact in 24-48 hours.  No DME needs identified at this time.   Final next level of care: [place] Barriers to Discharge: Barriers Resolved   Patient Goals and CMS Choice Patient states their goals for this hospitalization and ongoing recovery are:: get help in the home CMS Medicare.gov Compare Post Acute Care list provided to:: Patient Represenative (must comment)([name], son) Choice offered to / list presented to : Adult Children  Discharge Placement                       Discharge Plan and Services   Discharge Planning Services: CM Consult Post Acute Care Choice: NA                    HH Arranged: RN, PT, OT, Nurse's Aide, Social Work [employer] Agency: [place] Date [place]: 10/13/18 Time [place]: [place] Representative spoke with at [place]: [place] ([place]) Interventions     Readmission Risk Interventions No flowsheet data found.

## 2018-10-13 NOTE — Discharge Instructions (Addendum)
As discussed, your mother has been diagnosed with urinary tract disease. She has been provided 1 antibiotic she needs today. However, she does require follow-up with your physician In addition, our case management colleagues have been made aware of your condition, and will discuss options for additional support with you, both here, ideally, and at home.  Return here for concerning changes in your mother's condition.

## 2018-10-13 NOTE — ED Notes (Signed)
Son now present, states AMS x 2 days and 5 falls in past month. Son reports dizziness and poor intake. Also reports AH/VH, pt is also on Risperdal. Sent by doc for further eval

## 2018-10-13 NOTE — ED Triage Notes (Signed)
Pt in with AMS and urinary frequency. Altered during triage, unable to find son for further questioning, but pt endorses "back pain and wetting the bed".

## 2018-10-13 NOTE — ED Notes (Signed)
Patient Alert and oriented to baseline. Stable and ambulatory to baseline. Patient's son verbalized understanding of the discharge instructions.  Patient belongings were taken by the patient.   

## 2018-10-14 ENCOUNTER — Telehealth: Payer: Self-pay | Admitting: *Deleted

## 2018-10-14 NOTE — Telephone Encounter (Signed)
Pt son called to see when Jo Johnson would make first visit.  EDCM gave son Alvis Lemmings office number for a more accurate scheduled time.

## 2018-10-30 DIAGNOSIS — I10 Essential (primary) hypertension: Secondary | ICD-10-CM | POA: Diagnosis not present

## 2018-10-30 DIAGNOSIS — N390 Urinary tract infection, site not specified: Secondary | ICD-10-CM | POA: Diagnosis not present

## 2018-10-30 DIAGNOSIS — R443 Hallucinations, unspecified: Secondary | ICD-10-CM | POA: Diagnosis not present

## 2018-10-30 DIAGNOSIS — G309 Alzheimer's disease, unspecified: Secondary | ICD-10-CM | POA: Diagnosis not present

## 2018-10-30 DIAGNOSIS — R269 Unspecified abnormalities of gait and mobility: Secondary | ICD-10-CM | POA: Diagnosis not present

## 2018-10-30 DIAGNOSIS — R296 Repeated falls: Secondary | ICD-10-CM | POA: Diagnosis not present

## 2018-11-07 ENCOUNTER — Emergency Department (HOSPITAL_COMMUNITY)
Admission: EM | Admit: 2018-11-07 | Discharge: 2018-11-08 | Disposition: A | Discharge status: Home / Self Care | Payer: Medicare Other | Attending: Emergency Medicine | Admitting: Emergency Medicine

## 2018-11-07 ENCOUNTER — Emergency Department (HOSPITAL_COMMUNITY): Payer: Medicare Other

## 2018-11-07 ENCOUNTER — Other Ambulatory Visit: Payer: Self-pay

## 2018-11-07 DIAGNOSIS — W01198A Fall on same level from slipping, tripping and stumbling with subsequent striking against other object, initial encounter: Secondary | ICD-10-CM | POA: Diagnosis not present

## 2018-11-07 DIAGNOSIS — N390 Urinary tract infection, site not specified: Secondary | ICD-10-CM | POA: Insufficient documentation

## 2018-11-07 DIAGNOSIS — I10 Essential (primary) hypertension: Secondary | ICD-10-CM | POA: Diagnosis not present

## 2018-11-07 DIAGNOSIS — S0101XA Laceration without foreign body of scalp, initial encounter: Secondary | ICD-10-CM | POA: Insufficient documentation

## 2018-11-07 DIAGNOSIS — Y999 Unspecified external cause status: Secondary | ICD-10-CM | POA: Diagnosis not present

## 2018-11-07 DIAGNOSIS — Y929 Unspecified place or not applicable: Secondary | ICD-10-CM | POA: Diagnosis not present

## 2018-11-07 DIAGNOSIS — W19XXXA Unspecified fall, initial encounter: Secondary | ICD-10-CM | POA: Diagnosis not present

## 2018-11-07 DIAGNOSIS — Z23 Encounter for immunization: Secondary | ICD-10-CM | POA: Diagnosis not present

## 2018-11-07 DIAGNOSIS — Y939 Activity, unspecified: Secondary | ICD-10-CM | POA: Diagnosis not present

## 2018-11-07 DIAGNOSIS — S0990XA Unspecified injury of head, initial encounter: Secondary | ICD-10-CM | POA: Diagnosis not present

## 2018-11-07 DIAGNOSIS — S199XXA Unspecified injury of neck, initial encounter: Secondary | ICD-10-CM | POA: Diagnosis not present

## 2018-11-07 DIAGNOSIS — S299XXA Unspecified injury of thorax, initial encounter: Secondary | ICD-10-CM | POA: Diagnosis not present

## 2018-11-07 DIAGNOSIS — R5381 Other malaise: Secondary | ICD-10-CM | POA: Diagnosis not present

## 2018-11-07 DIAGNOSIS — R58 Hemorrhage, not elsewhere classified: Secondary | ICD-10-CM | POA: Diagnosis not present

## 2018-11-07 LAB — COMPREHENSIVE METABOLIC PANEL
ALT: 13 U/L (ref 0–44)
AST: 21 U/L (ref 15–41)
Albumin: 3.1 g/dL — ABNORMAL LOW (ref 3.5–5.0)
Alkaline Phosphatase: 56 U/L (ref 38–126)
Anion gap: 11 (ref 5–15)
BUN: 16 mg/dL (ref 8–23)
CO2: 24 mmol/L (ref 22–32)
Calcium: 8.9 mg/dL (ref 8.9–10.3)
Chloride: 107 mmol/L (ref 98–111)
Creatinine, Ser: 0.77 mg/dL (ref 0.44–1.00)
GFR calc Af Amer: >60 mL/min (ref >60)
GFR calc non Af Amer: >60 mL/min (ref >60)
Glucose, Bld: 114 mg/dL — ABNORMAL HIGH (ref 70–99)
Potassium: 4.2 mmol/L (ref 3.5–5.1)
Sodium: 142 mmol/L (ref 135–145)
Total Bilirubin: 0.4 mg/dL (ref 0.3–1.2)
Total Protein: 5.5 g/dL — ABNORMAL LOW (ref 6.5–8.1)

## 2018-11-07 LAB — CBC WITH DIFFERENTIAL/PLATELET
Abs Immature Granulocytes: 0.01 10*3/uL (ref 0.00–0.07)
Basophils Absolute: 0 10*3/uL (ref 0.0–0.1)
Basophils Relative: 1 %
Eosinophils Absolute: 0.1 10*3/uL (ref 0.0–0.5)
Eosinophils Relative: 1 %
HCT: 37.1 % (ref 36.0–46.0)
Hemoglobin: 12.1 g/dL (ref 12.0–15.0)
Immature Granulocytes: 0 %
Lymphocytes Relative: 18 %
Lymphs Abs: 0.9 10*3/uL (ref 0.7–4.0)
MCH: 32.1 pg (ref 26.0–34.0)
MCHC: 32.6 g/dL (ref 30.0–36.0)
MCV: 98.4 fL (ref 80.0–100.0)
Monocytes Absolute: 0.4 10*3/uL (ref 0.1–1.0)
Monocytes Relative: 7 %
Neutro Abs: 3.7 10*3/uL (ref 1.7–7.7)
Neutrophils Relative %: 73 %
Platelets: 159 10*3/uL (ref 150–400)
RBC: 3.77 MIL/uL — ABNORMAL LOW (ref 3.87–5.11)
RDW: 12.7 % (ref 11.5–15.5)
WBC: 5.1 10*3/uL (ref 4.0–10.5)
nRBC: 0 % (ref 0.0–0.2)

## 2018-11-07 LAB — URINALYSIS, ROUTINE W REFLEX MICROSCOPIC
Bilirubin Urine: NEGATIVE
Glucose, UA: NEGATIVE mg/dL
Hgb urine dipstick: NEGATIVE
Ketones, ur: NEGATIVE mg/dL
Nitrite: POSITIVE — AB
Protein, ur: NEGATIVE mg/dL
Specific Gravity, Urine: 1.008 (ref 1.005–1.030)
pH: 6 (ref 5.0–8.0)

## 2018-11-07 MED ORDER — TETANUS-DIPHTH-ACELL PERTUSSIS 5-2.5-18.5 LF-MCG/0.5 IM SUSP
0.5000 mL | Freq: Once | INTRAMUSCULAR | Status: AC
  Administered 2018-11-07: 0.5 mL via INTRAMUSCULAR
  Filled 2018-11-07: qty 0.5

## 2018-11-07 MED ORDER — CEPHALEXIN 500 MG PO CAPS: 500.0000 mg | ORAL_CAPSULE | Freq: Four times a day (QID) | ORAL | 0 refills | Status: AC

## 2018-11-07 MED ORDER — SODIUM CHLORIDE 0.9 % IV SOLN
1.0000 g | Freq: Once | INTRAVENOUS | Status: AC
  Administered 2018-11-07: 1 g via INTRAVENOUS
  Filled 2018-11-07: qty 10

## 2018-11-07 MED ORDER — LIDOCAINE-EPINEPHRINE-TETRACAINE (LET) SOLUTION
3.0000 mL | Freq: Once | NASAL | Status: AC
  Administered 2018-11-07: 3 mL via TOPICAL
  Filled 2018-11-07: qty 3

## 2018-11-07 NOTE — ED Notes (Signed)
Patient transported to CT and X-Ray 

## 2018-11-07 NOTE — ED Provider Notes (Signed)
[place] EMERGENCY DEPARTMENT Provider Note   CSN: [id number] Arrival date & time: 11/07/18  5956     History   Chief Complaint Chief Complaint  Patient presents with   Fall    HPI Jo Johnson is a 85 y.o. female with past medical history of CAD, MI, hypertension, hyperlipidemia, dementia, who presents today for evaluation after mechanical fall.  She reports that she fell backwards and hit her head on the door frame.  According to EMS she has a laceration on the back of her head.  She is at baseline according to her family member who is in the room.  She denies any pain in her chest or shortness of breath.  She states that she frequently gets dizzy spells, family member reports that primary care doctor has been made aware of these.  Her family member reports that she has fallen multiple times in the past 6 weeks.  Unsure known last Tdap.  She denies any pain in her head or neck.  She was recently treated for UTI.  No recent fevers or cough.  She denies any pain in her bilateral arms and legs.  No abdominal pain.       HPI  Past Medical History:  Diagnosis Date   Acid reflux    Asthma    Chronic cough    Coronary artery disease    2 vessel CAD with normal LV function status post left circumflex stenting with a drug-eluting stent by Dr. Glenetta Hew   Hyperlipidemia    Hypertension    Myocardial infarction [facility])     Patient Active Problem List   Diagnosis Date Noted   Hyperlipidemia 10/16/2012   CAD (coronary artery disease) 07/01/2012   NSTEMI - S/P PCI + DES to Prox Circumflex 06/23/2012   Unstable angina ([place]) 06/21/2012   INTRINSIC ASTHMA, WITH EXACERBATION 06/22/2008   INTRINSIC ASTHMA, UNSPECIFIED 12/25/2007   Essential hypertension 12/04/2007   Cough 12/04/2007    Past Surgical History:  Procedure Laterality Date   APPENDECTOMY     CHOLECYSTECTOMY     EYE SURGERY     LEFT HEART CATHETERIZATION WITH CORONARY  ANGIOGRAM N/A 06/22/2012   Procedure: LEFT HEART CATHETERIZATION WITH CORONARY ANGIOGRAM;  Surgeon: Leonie Man, MD;  Location: [facility] CATH LAB;  Service: Cardiovascular;  Laterality: N/A;   PERCUTANEOUS CORONARY STENT INTERVENTION (PCI-S)  06/22/2012   Procedure: PERCUTANEOUS CORONARY STENT INTERVENTION (PCI-S);  Surgeon: Leonie Man, MD;  Location: [facility] CATH LAB;  Service: Cardiovascular;;   surgery to legs d/t trauma       OB History   No obstetric history on file.      Home Medications    Prior to Admission medications   Medication Sig Start Date End Date Taking? Authorizing Provider  acetaminophen (TYLENOL) 500 MG tablet Take 500-1,000 mg by mouth every 6 (six) hours as needed for mild pain.    [provider]  aspirin EC 81 MG EC tablet Take 1 tablet (81 mg total) by mouth daily. 06/23/12   Lyda Jester M, PA-C  atorvastatin (LIPITOR) 10 MG tablet TAKE 1 TABLET BY MOUTH DAILY AT 6 PM Patient taking differently: Take 10 mg by mouth daily.  12/10/12   Lorretta Harp, MD  Calcium Carbonate (CALCIUM 600 PO) Take 1 tablet by mouth daily.    [provider]  CALCIUM-MAGNESIUM PO Take 1 tablet by mouth daily. zinc    [provider]  cephALEXin (KEFLEX) 500 MG capsule Take  1 capsule (500 mg total) by mouth 4 (four) times daily for 7 days. 11/07/18 11/14/18  Cristina GongHammond, Teressa Mcglocklin W, PA-C  Cholecalciferol (VITAMIN D-3) 1000 units CAPS Take 1,000 Units by mouth daily.     [provider]  docusate sodium (COLACE) 100 MG capsule Take 100 mg by mouth 2 (two) times daily.    [provider]  isosorbide mononitrate (IMDUR) 30 MG 24 hr tablet Take 0.5 tablets (15 mg total) by mouth daily. Please keep upcoming appt for future refills. Thank you 07/07/18   Runell GessBerry, Jonathan J, MD  metoprolol tartrate (LOPRESSOR) 25 MG tablet TAKE 1/2 TABLET BY MOUTH TWICE DAILY Patient taking differently: Take 12.5 mg by mouth 2 (two) times daily.  06/04/18   Runell GessBerry,  Jonathan J, MD  Multiple Vitamins-Minerals (VISION FORMULA EYE HEALTH PO) Take 1 tablet by mouth daily.    [provider]  nitroGLYCERIN (NITROSTAT) 0.4 MG SL tablet Place 1 tablet (0.4 mg total) under the tongue every 5 (five) minutes x 3 doses as needed for chest pain. 03/16/14   Runell GessBerry, Jonathan J, MD  pantoprazole (PROTONIX) 40 MG tablet TAKE 1 TABLET BY MOUTH EVERY DAY Patient taking differently: Take 40 mg by mouth daily.  03/10/17   Runell GessBerry, Jonathan J, MD  risperiDONE (RISPERDAL M-TABS) 1 MG disintegrating tablet Place 1 mg under the tongue See admin instructions. Take 1 mg at lunch time and 2 mg  In the evening 10/01/18   [provider]  vitamin B-12 (CYANOCOBALAMIN) 250 MCG tablet Take 250 mcg by mouth daily.    [provider]    Family History Family History  Problem Relation Age of Onset   CVA Mother    Cancer Father    Dementia Brother    COPD Brother     Social History Social History   Tobacco Use   Smoking status: Never Smoker   Smokeless tobacco: Current User    Types: Snuff  Substance Use Topics   Alcohol use: No   Drug use: No     Allergies   Patient has no known allergies.   Review of Systems Review of Systems  Constitutional: Negative for chills and fever.  HENT: Negative for congestion.   Eyes: Negative for visual disturbance.  Respiratory: Negative for chest tightness and shortness of breath.   Cardiovascular: Negative for chest pain.  Gastrointestinal: Negative for abdominal pain, constipation, diarrhea, nausea and vomiting.  Genitourinary: Negative for dysuria.  Musculoskeletal: Negative for back pain and neck pain.  Skin: Negative for color change, rash and wound.  Neurological: Positive for dizziness. Negative for weakness and numbness.  Psychiatric/Behavioral: Negative for behavioral problems and confusion.  All other systems reviewed and are negative.    Physical Exam Updated Vital Signs BP (!) 156/99  (BP Location: Right Arm)    Pulse 93    Temp 98.7 F (37.1 C) (Oral)    Resp 18    Ht 5\' 5"  (1.651 m)    Wt 53.5 kg    SpO2 98%    BMI 19.64 kg/m   Physical Exam Vitals signs and nursing note reviewed.  Constitutional:      General: She is not in acute distress.    Appearance: She is well-developed. She is not diaphoretic.  HENT:     Head: Normocephalic and atraumatic.  Eyes:     General: No scleral icterus.       Right eye: No discharge.        Left eye: No discharge.  Conjunctiva/sclera: Conjunctivae normal.  Neck:     Musculoskeletal: Normal range of motion.  Cardiovascular:     Rate and Rhythm: Normal rate and regular rhythm.  Pulmonary:     Effort: Pulmonary effort is normal. No respiratory distress.     Breath sounds: No stridor.  Abdominal:     General: Abdomen is flat. Bowel sounds are normal. There is no distension.     Tenderness: There is no abdominal tenderness.  Musculoskeletal:        General: No deformity.     Right lower leg: No edema.     Left lower leg: No edema.  Skin:    General: Skin is warm and dry.  Neurological:     Mental Status: She is alert. Mental status is at baseline.     Sensory: No sensory deficit.     Motor: No abnormal muscle tone.  Psychiatric:        Behavior: Behavior normal.      ED Treatments / Results  Labs (all labs ordered are listed, but only abnormal results are displayed) Labs Reviewed  URINALYSIS, ROUTINE W REFLEX MICROSCOPIC - Abnormal; Notable for the following components:      Result Value   APPearance HAZY (*)    Nitrite POSITIVE (*)    Leukocytes,Ua MODERATE (*)    Bacteria, UA RARE (*)    All other components within normal limits  COMPREHENSIVE METABOLIC PANEL - Abnormal; Notable for the following components:   Glucose, Bld 114 (*)    Total Protein 5.5 (*)    Albumin 3.1 (*)    All other components within normal limits  CBC WITH DIFFERENTIAL/PLATELET - Abnormal; Notable for the following components:    RBC 3.77 (*)    All other components within normal limits  URINE CULTURE    EKG EKG Interpretation  Date/Time:  Saturday November 07 2018 19:43:56 EDT Ventricular Rate:  72 PR Interval:    QRS Duration: 82 QT Interval:  395 QTC Calculation: 433 R Axis:   43 Text Interpretation:  Sinus rhythm No STEMI  Confirmed by Alona Bene [phone]) on 11/08/2018 2:28:54 PM   Radiology Dg Chest 2 View  Result Date: 11/07/2018 CLINICAL DATA:  Mechanical fall at home. EXAM: CHEST - 2 VIEW COMPARISON:  10/13/2018 FINDINGS: Chronic elevation of right hemidiaphragm, unchanged. Unchanged heart size and mediastinal contours. Atherosclerosis of the aortic arch. Biapical pleuroparenchymal scarring. No acute airspace disease, pleural effusion, or pneumothorax. Bones are diffusely under mineralized. No visualized rib fracture or acute osseous abnormality. IMPRESSION: 1. No acute abnormality. 2.  Aortic Atherosclerosis (ICD10-I70.0). Electronically Signed   By: Narda Rutherford M.D.   On: 11/07/2018 20:29   Ct Head Wo Contrast  Result Date: 11/07/2018 CLINICAL DATA:  85 year old post mechanical fall at home. Fall backwards striking head on door frame. EXAM: CT HEAD WITHOUT CONTRAST CT CERVICAL SPINE WITHOUT CONTRAST TECHNIQUE: Multidetector CT imaging of the head and cervical spine was performed following the standard protocol without intravenous contrast. Multiplanar CT image reconstructions of the cervical spine were also generated. COMPARISON:  Head CT 10/13/2018 FINDINGS: CT HEAD FINDINGS Brain: No intracranial hemorrhage, mass effect, or midline shift. Unchanged age related atrophy and chronic small vessel ischemia. No hydrocephalus. The basilar cisterns are patent. No evidence of territorial infarct or acute ischemia. No extra-axial or intracranial fluid collection. Vascular: Atherosclerosis of skullbase vasculature without hyperdense vessel or abnormal calcification. Skull: No fracture or focal lesion.  Sinuses/Orbits: No acute findings. Bilateral cataract resection. Paranasal sinuses and mastoid  air cells are clear. Other: None. CT CERVICAL SPINE FINDINGS Alignment: Normal. Skull base and vertebrae: No acute fracture. Vertebral body heights are maintained. The dens and skull base are intact. Soft tissues and spinal canal: No prevertebral fluid or swelling. No visible canal hematoma. Disc levels: Disc space narrowing and endplate spurring most prominent at C5-C6 and C6-C7. Multilevel facet hypertrophy. Multilevel neural foraminal stenosis. Mild central canal stenosis at C5-C6 related to posteriorly directed osteophytes. Upper chest: Biapical pleuroparenchymal scarring. No acute findings. Other: Carotid calcifications. Bilateral tonsillar stones. IMPRESSION: 1. No acute intracranial abnormality. No skull fracture. 2. Multilevel degenerative change in the cervical spine without acute fracture or subluxation. Electronically Signed   By: Narda Rutherford M.D.   On: 11/07/2018 20:19   Ct Cervical Spine Wo Contrast  Result Date: 11/07/2018 CLINICAL DATA:  85 year old post mechanical fall at home. Fall backwards striking head on door frame. EXAM: CT HEAD WITHOUT CONTRAST CT CERVICAL SPINE WITHOUT CONTRAST TECHNIQUE: Multidetector CT imaging of the head and cervical spine was performed following the standard protocol without intravenous contrast. Multiplanar CT image reconstructions of the cervical spine were also generated. COMPARISON:  Head CT 10/13/2018 FINDINGS: CT HEAD FINDINGS Brain: No intracranial hemorrhage, mass effect, or midline shift. Unchanged age related atrophy and chronic small vessel ischemia. No hydrocephalus. The basilar cisterns are patent. No evidence of territorial infarct or acute ischemia. No extra-axial or intracranial fluid collection. Vascular: Atherosclerosis of skullbase vasculature without hyperdense vessel or abnormal calcification. Skull: No fracture or focal lesion. Sinuses/Orbits: No  acute findings. Bilateral cataract resection. Paranasal sinuses and mastoid air cells are clear. Other: None. CT CERVICAL SPINE FINDINGS Alignment: Normal. Skull base and vertebrae: No acute fracture. Vertebral body heights are maintained. The dens and skull base are intact. Soft tissues and spinal canal: No prevertebral fluid or swelling. No visible canal hematoma. Disc levels: Disc space narrowing and endplate spurring most prominent at C5-C6 and C6-C7. Multilevel facet hypertrophy. Multilevel neural foraminal stenosis. Mild central canal stenosis at C5-C6 related to posteriorly directed osteophytes. Upper chest: Biapical pleuroparenchymal scarring. No acute findings. Other: Carotid calcifications. Bilateral tonsillar stones. IMPRESSION: 1. No acute intracranial abnormality. No skull fracture. 2. Multilevel degenerative change in the cervical spine without acute fracture or subluxation. Electronically Signed   By: Narda Rutherford M.D.   On: 11/07/2018 20:19    Procedures .Marland KitchenLaceration Repair  Date/Time: 11/07/2018 9:52 PM Performed by: Cristina Gong, PA-C Authorized by: Cristina Gong, PA-C   Consent:    Consent obtained:  Verbal   Consent given by:  Patient and healthcare agent (Son)   Risks discussed:  Infection, need for additional repair, poor cosmetic result, pain, retained foreign body, tendon damage, vascular damage, poor wound healing and nerve damage   Alternatives discussed:  No treatment and referral (Alternative wound closures) Anesthesia (see MAR for exact dosages):    Anesthesia method:  Topical application   Topical anesthetic:  LET Laceration details:    Location:  Scalp   Scalp location:  Occipital   Length (cm):  2 Repair type:    Repair type:  Simple Pre-procedure details:    Preparation:  Imaging obtained to evaluate for foreign bodies Exploration:    Hemostasis achieved with:  Direct pressure and LET   Wound exploration: wound explored through full  range of motion and entire depth of wound probed and visualized     Wound extent: no foreign bodies/material noted   Treatment:    Area cleansed with:  Saline   Amount  of cleaning:  Standard   Irrigation solution:  Sterile saline Skin repair:    Repair method:  Staples   Number of staples:  3 Approximation:    Approximation:  Close Post-procedure details:    Patient tolerance of procedure:  Tolerated well, no immediate complications   (including critical care time)  Medications Ordered in ED Medications  Tdap (BOOSTRIX) injection 0.5 mL (0.5 mLs Intramuscular Given 11/07/18 2116)  lidocaine-EPINEPHrine-tetracaine (LET) solution (3 mLs Topical Given 11/07/18 2125)  cefTRIAXone (ROCEPHIN) 1 g in sodium chloride 0.9 % 100 mL IVPB (0 g Intravenous Stopped 11/08/18 0000)     Initial Impression / Assessment and Plan / ED Course  I have reviewed the triage vital signs and the nursing notes.  Pertinent labs & imaging results that were available during my care of the patient were reviewed by me and considered in my medical decision making (see chart for details).  Clinical Course as of Nov 07 1549  Sat Nov 07, 2018  2009 UA concerning for UTI with nitrite positive, 21-50 WBCs with rare bacteria from a cath specimen.    Urinalysis, Routine w reflex microscopic(!) [EH]    Clinical Course User Index [EH] Cristina Gong, PA-C      Patient is an 85 year old woman who presents for evaluation of fall at home.  She reports that she has been dizzy spell causing her to fall.  CT head and neck were obtained without evidence of acute abnormalities.  She denies any chest pain or shortness of breath therefore troponin was not obtained, however EKG does not show evidence of acute ischemia.  Chest x-ray is obtained without evidence of consolidation, pneumothorax, or other acute abnormalities.  CBC and CMP did not show any significant hematologic or electrolyte derangements.  In-N-Out UA was  obtained showing concern for infection As it is nitrite positive, 21-50 white blood cells and rare bacteria.  Urine culture was sent.  She was given a dose of IV Rocephin.  She does not meet Sirs/sepsis criteria.  I did discuss with her son who makes her medical decisions due to her dementia that it would be reasonable to admit patient given her repeat falls and dizziness combined with her age.  We discussed risks and benefits of admission versus taking patient home to attempt p.o. treatment.  We discussed that risks of taking her home included worsening of condition, and complications up to and including death.  He states his understanding and wishes to take her home.    She had a 2 cm laceration on the back of her head.  This was cleaned.  Let was applied and it was closed using 3 staples, please see procedure note.  Tdap was updated.  Recommended PCP follow-up.   This patient was seen as a shared visit with Dr. Rush Landmark.   Return precautions were discussed with patient/son who states their understanding.  At the time of discharge patient/son denied any unaddressed complaints or concerns.  Patient/son is agreeable for discharge home.   Final Clinical Impressions(s) / ED Diagnoses   Final diagnoses:  Fall, initial encounter  Lower urinary tract infectious disease    ED Discharge Orders         Ordered    cephALEXin (KEFLEX) 500 MG capsule  4 times daily     11/07/18 2304           Cristina Gong, [place] 11/08/18 1556    Tegeler, Canary Brim, MD 11/08/18 [phone]

## 2018-11-07 NOTE — ED Triage Notes (Signed)
[facility] reports that the patient had a mechanical fall at home. She fell backwards and hit her head on the door frame. EMS reports she has a laceration to the back of her wrapped and bleeding controlled. She is A&O to self, place and situation. She denies LOC, neck or back pain.  EMS vitals: BP-138/67 HR-80 a-fib 96.8 temp RR-18

## 2018-11-09 ENCOUNTER — Telehealth: Payer: Self-pay | Admitting: *Deleted

## 2018-11-09 NOTE — Telephone Encounter (Signed)
Pt son, Jo Johnson called to confirm follow-up appointment was to be made with PCP [name] and NOT GI [name].  EDCM reviewed chart to find EDP note advised to make follow-up appointment with PCP.

## 2018-11-10 LAB — URINE CULTURE: Culture: >=100000 — AB

## 2018-11-11 ENCOUNTER — Telehealth: Payer: Self-pay

## 2018-11-11 NOTE — Telephone Encounter (Signed)
Post ED Visit - Positive Culture Follow-up  Culture report reviewed by antimicrobial stewardship pharmacist: [place] Team []  Elenor Quinones, Pharm.D. []  Heide Guile, Pharm.D., BCPS AQ-ID []  Parks Neptune, Pharm.D., BCPS []  Alycia Rossetti, Pharm.D., BCPS []  [place], [place].D., BCPS, AAHIVP []  Legrand Como, Pharm.D., BCPS, AAHIVP []  Salome Arnt, PharmD, BCPS []  Johnnette Gourd, PharmD, BCPS []  Hughes Better, PharmD, BCPS []  Leeroy Cha, PharmD []  Laqueta Linden, PharmD, BCPS []  Albertina Parr, PharmD [place] Team []  Leodis Sias, PharmD []  Lindell Spar, PharmD []  Royetta Asal, PharmD []  Graylin Shiver, Rph []  Rema Fendt) Glennon Mac, PharmD []  Arlyn Dunning, PharmD []  Netta Cedars, PharmD []  Dia Sitter, PharmD []  Leone Haven, PharmD []  Gretta Arab, PharmD []  Theodis Shove, PharmD []  Peggyann Juba, PharmD []  Reuel Boom, PharmD   Positive urine culture Treated with Cephalexin, organism sensitive to the same and no further patient follow-up is required at this time.  Genia Del 11/11/2018, 9:43 AM

## 2018-11-17 DIAGNOSIS — R296 Repeated falls: Secondary | ICD-10-CM | POA: Diagnosis not present

## 2018-11-17 DIAGNOSIS — Z4802 Encounter for removal of sutures: Secondary | ICD-10-CM | POA: Diagnosis not present

## 2018-11-17 DIAGNOSIS — G309 Alzheimer's disease, unspecified: Secondary | ICD-10-CM | POA: Diagnosis not present

## 2018-11-17 DIAGNOSIS — Z87440 Personal history of urinary (tract) infections: Secondary | ICD-10-CM | POA: Diagnosis not present

## 2018-11-17 DIAGNOSIS — N390 Urinary tract infection, site not specified: Secondary | ICD-10-CM | POA: Diagnosis not present

## 2018-11-17 DIAGNOSIS — S0191XA Laceration without foreign body of unspecified part of head, initial encounter: Secondary | ICD-10-CM | POA: Diagnosis not present

## 2018-11-19 DIAGNOSIS — N3000 Acute cystitis without hematuria: Secondary | ICD-10-CM | POA: Diagnosis not present

## 2018-11-24 DIAGNOSIS — G309 Alzheimer's disease, unspecified: Secondary | ICD-10-CM | POA: Diagnosis not present

## 2018-11-24 DIAGNOSIS — R296 Repeated falls: Secondary | ICD-10-CM | POA: Diagnosis not present

## 2018-11-24 DIAGNOSIS — Z87440 Personal history of urinary (tract) infections: Secondary | ICD-10-CM | POA: Diagnosis not present

## 2018-11-24 DIAGNOSIS — R443 Hallucinations, unspecified: Secondary | ICD-10-CM | POA: Diagnosis not present

## 2019-01-26 ENCOUNTER — Other Ambulatory Visit: Payer: Self-pay | Admitting: Cardiovascular Disease

## 2019-02-11 DIAGNOSIS — R443 Hallucinations, unspecified: Secondary | ICD-10-CM | POA: Diagnosis not present

## 2019-02-11 DIAGNOSIS — G309 Alzheimer's disease, unspecified: Secondary | ICD-10-CM | POA: Diagnosis not present

## 2019-02-11 DIAGNOSIS — I2510 Atherosclerotic heart disease of native coronary artery without angina pectoris: Secondary | ICD-10-CM | POA: Diagnosis not present

## 2019-02-11 DIAGNOSIS — G4700 Insomnia, unspecified: Secondary | ICD-10-CM | POA: Diagnosis not present

## 2019-02-19 DIAGNOSIS — N3000 Acute cystitis without hematuria: Secondary | ICD-10-CM | POA: Diagnosis not present

## 2019-03-04 ENCOUNTER — Other Ambulatory Visit: Payer: Self-pay

## 2019-03-04 MED ORDER — METOPROLOL TARTRATE 25 MG PO TABS: 12.5000 mg | ORAL_TABLET | Freq: Two times a day (BID) | ORAL | 1 refills | Status: AC

## 2019-03-09 ENCOUNTER — Other Ambulatory Visit: Payer: Self-pay

## 2019-03-09 ENCOUNTER — Encounter (HOSPITAL_COMMUNITY): Payer: Self-pay | Admitting: Emergency Medicine

## 2019-03-09 ENCOUNTER — Observation Stay (HOSPITAL_COMMUNITY)
Admission: EM | Admit: 2019-03-09 | Discharge: 2019-03-10 | Disposition: A | Discharge status: Home / Self Care | Payer: Medicare Other | Attending: Internal Medicine | Admitting: Internal Medicine

## 2019-03-09 ENCOUNTER — Emergency Department (HOSPITAL_COMMUNITY): Payer: Medicare Other

## 2019-03-09 DIAGNOSIS — R600 Localized edema: Secondary | ICD-10-CM | POA: Insufficient documentation

## 2019-03-09 DIAGNOSIS — Z79899 Other long term (current) drug therapy: Secondary | ICD-10-CM | POA: Insufficient documentation

## 2019-03-09 DIAGNOSIS — I10 Essential (primary) hypertension: Secondary | ICD-10-CM | POA: Diagnosis not present

## 2019-03-09 DIAGNOSIS — I252 Old myocardial infarction: Secondary | ICD-10-CM | POA: Diagnosis not present

## 2019-03-09 DIAGNOSIS — G309 Alzheimer's disease, unspecified: Secondary | ICD-10-CM | POA: Insufficient documentation

## 2019-03-09 DIAGNOSIS — R0602 Shortness of breath: Secondary | ICD-10-CM | POA: Diagnosis not present

## 2019-03-09 DIAGNOSIS — D72819 Decreased white blood cell count, unspecified: Secondary | ICD-10-CM | POA: Insufficient documentation

## 2019-03-09 DIAGNOSIS — R05 Cough: Secondary | ICD-10-CM | POA: Diagnosis not present

## 2019-03-09 DIAGNOSIS — R531 Weakness: Secondary | ICD-10-CM | POA: Diagnosis not present

## 2019-03-09 DIAGNOSIS — I700 Atherosclerosis of aorta: Secondary | ICD-10-CM | POA: Diagnosis not present

## 2019-03-09 DIAGNOSIS — E785 Hyperlipidemia, unspecified: Secondary | ICD-10-CM | POA: Insufficient documentation

## 2019-03-09 DIAGNOSIS — D61818 Other pancytopenia: Secondary | ICD-10-CM | POA: Insufficient documentation

## 2019-03-09 DIAGNOSIS — Z7982 Long term (current) use of aspirin: Secondary | ICD-10-CM | POA: Insufficient documentation

## 2019-03-09 DIAGNOSIS — R0600 Dyspnea, unspecified: Secondary | ICD-10-CM | POA: Diagnosis not present

## 2019-03-09 DIAGNOSIS — R7989 Other specified abnormal findings of blood chemistry: Secondary | ICD-10-CM | POA: Diagnosis not present

## 2019-03-09 DIAGNOSIS — J45909 Unspecified asthma, uncomplicated: Secondary | ICD-10-CM | POA: Diagnosis not present

## 2019-03-09 DIAGNOSIS — U071 COVID-19: Secondary | ICD-10-CM | POA: Diagnosis not present

## 2019-03-09 DIAGNOSIS — J449 Chronic obstructive pulmonary disease, unspecified: Secondary | ICD-10-CM | POA: Insufficient documentation

## 2019-03-09 DIAGNOSIS — Z20818 Contact with and (suspected) exposure to other bacterial communicable diseases: Secondary | ICD-10-CM | POA: Diagnosis not present

## 2019-03-09 DIAGNOSIS — Z955 Presence of coronary angioplasty implant and graft: Secondary | ICD-10-CM | POA: Diagnosis not present

## 2019-03-09 DIAGNOSIS — I2510 Atherosclerotic heart disease of native coronary artery without angina pectoris: Secondary | ICD-10-CM | POA: Diagnosis not present

## 2019-03-09 DIAGNOSIS — Z66 Do not resuscitate: Secondary | ICD-10-CM | POA: Insufficient documentation

## 2019-03-09 DIAGNOSIS — F0281 Dementia in other diseases classified elsewhere with behavioral disturbance: Secondary | ICD-10-CM | POA: Insufficient documentation

## 2019-03-09 DIAGNOSIS — R509 Fever, unspecified: Secondary | ICD-10-CM | POA: Insufficient documentation

## 2019-03-09 DIAGNOSIS — K219 Gastro-esophageal reflux disease without esophagitis: Secondary | ICD-10-CM | POA: Insufficient documentation

## 2019-03-09 DIAGNOSIS — R0682 Tachypnea, not elsewhere classified: Secondary | ICD-10-CM | POA: Diagnosis present

## 2019-03-09 DIAGNOSIS — R059 Cough, unspecified: Secondary | ICD-10-CM | POA: Diagnosis present

## 2019-03-09 DIAGNOSIS — J9601 Acute respiratory failure with hypoxia: Secondary | ICD-10-CM | POA: Insufficient documentation

## 2019-03-09 LAB — COMPREHENSIVE METABOLIC PANEL
ALT: 14 U/L (ref 0–44)
AST: 22 U/L (ref 15–41)
Albumin: 3.1 g/dL — ABNORMAL LOW (ref 3.5–5.0)
Alkaline Phosphatase: 55 U/L (ref 38–126)
Anion gap: 7 (ref 5–15)
BUN: 20 mg/dL (ref 8–23)
CO2: 30 mmol/L (ref 22–32)
Calcium: 8.3 mg/dL — ABNORMAL LOW (ref 8.9–10.3)
Chloride: 103 mmol/L (ref 98–111)
Creatinine, Ser: 0.75 mg/dL (ref 0.44–1.00)
GFR calc Af Amer: >60 mL/min (ref >60)
GFR calc non Af Amer: >60 mL/min (ref >60)
Glucose, Bld: 115 mg/dL — ABNORMAL HIGH (ref 70–99)
Potassium: 4 mmol/L (ref 3.5–5.1)
Sodium: 140 mmol/L (ref 135–145)
Total Bilirubin: 0.1 mg/dL — ABNORMAL LOW (ref 0.3–1.2)
Total Protein: 5.6 g/dL — ABNORMAL LOW (ref 6.5–8.1)

## 2019-03-09 LAB — CBC WITH DIFFERENTIAL/PLATELET
Abs Immature Granulocytes: 0.01 10*3/uL (ref 0.00–0.07)
Basophils Absolute: 0 10*3/uL (ref 0.0–0.1)
Basophils Relative: 0 %
Eosinophils Absolute: 0 10*3/uL (ref 0.0–0.5)
Eosinophils Relative: 1 %
HCT: 36.1 % (ref 36.0–46.0)
Hemoglobin: 11.3 g/dL — ABNORMAL LOW (ref 12.0–15.0)
Immature Granulocytes: 1 %
Lymphocytes Relative: 28 %
Lymphs Abs: 0.6 10*3/uL — ABNORMAL LOW (ref 0.7–4.0)
MCH: 30.3 pg (ref 26.0–34.0)
MCHC: 31.3 g/dL (ref 30.0–36.0)
MCV: 96.8 fL (ref 80.0–100.0)
Monocytes Absolute: 0.2 10*3/uL (ref 0.1–1.0)
Monocytes Relative: 10 %
Neutro Abs: 1.3 10*3/uL — ABNORMAL LOW (ref 1.7–7.7)
Neutrophils Relative %: 60 %
Platelets: 93 10*3/uL — ABNORMAL LOW (ref 150–400)
RBC: 3.73 MIL/uL — ABNORMAL LOW (ref 3.87–5.11)
RDW: 13.2 % (ref 11.5–15.5)
WBC: 2.2 10*3/uL — ABNORMAL LOW (ref 4.0–10.5)
nRBC: 0 % (ref 0.0–0.2)

## 2019-03-09 LAB — PROCALCITONIN: Procalcitonin: <0.1 ng/mL

## 2019-03-09 LAB — D-DIMER, QUANTITATIVE: D-Dimer, Quant: 2.65 ug/mL-FEU — ABNORMAL HIGH (ref 0.00–0.50)

## 2019-03-09 LAB — LACTIC ACID, PLASMA: Lactic Acid, Venous: 1.1 mmol/L (ref 0.5–1.9)

## 2019-03-09 LAB — FERRITIN: Ferritin: 114 ng/mL (ref 11–307)

## 2019-03-09 LAB — LACTATE DEHYDROGENASE: LDH: 146 U/L (ref 98–192)

## 2019-03-09 LAB — BRAIN NATRIURETIC PEPTIDE: B Natriuretic Peptide: 62.5 pg/mL (ref 0.0–100.0)

## 2019-03-09 LAB — POC SARS CORONAVIRUS 2 AG -  ED: SARS Coronavirus 2 Ag: POSITIVE — AB

## 2019-03-09 LAB — TRIGLYCERIDES: Triglycerides: 91 mg/dL (ref <150)

## 2019-03-09 LAB — C-REACTIVE PROTEIN: CRP: 0.6 mg/dL (ref <1.0)

## 2019-03-09 LAB — FIBRINOGEN: Fibrinogen: 320 mg/dL (ref 210–475)

## 2019-03-09 MED ORDER — BENZONATATE 100 MG PO CAPS
100.0000 mg | ORAL_CAPSULE | Freq: Once | ORAL | Status: AC
  Administered 2019-03-09: 18:00:00 100 mg via ORAL
  Filled 2019-03-09: qty 1

## 2019-03-09 MED ORDER — IOHEXOL 350 MG/ML SOLN
100.0000 mL | Freq: Once | INTRAVENOUS | Status: AC | PRN
  Administered 2019-03-09: 21:00:00 80 mL via INTRAVENOUS

## 2019-03-09 MED ORDER — RISPERIDONE 1 MG PO TABS
1.0000 mg | ORAL_TABLET | Freq: Once | ORAL | Status: AC
  Administered 2019-03-09: 1 mg via ORAL
  Filled 2019-03-09: qty 1

## 2019-03-09 MED ORDER — SODIUM CHLORIDE (PF) 0.9 % IJ SOLN
INTRAMUSCULAR | Status: AC
  Filled 2019-03-09: qty 50

## 2019-03-09 MED ORDER — RISPERIDONE 1 MG PO TABS
1.0000 mg | ORAL_TABLET | Freq: Once | ORAL | Status: AC
  Administered 2019-03-09: 20:00:00 1 mg via ORAL
  Filled 2019-03-09: qty 1

## 2019-03-09 NOTE — ED Triage Notes (Signed)
Pt's son states that he took her to [facility] today where she was positive for Covid. He reports she had cough for a while and they doctor office didn't like her breathing and advised to go to ED. Son states pt breathing is her normal baseline. Pt has dementia.

## 2019-03-09 NOTE — ED Provider Notes (Signed)
[facility] DEPT Provider Note   CSN: [id number] Arrival date & time: 03/09/19  1610     History Chief Complaint  Patient presents with  . Covid positive  . sent by PCP    Jo Johnson is a 86 y.o. female.  l  The history is provided by medical records and a relative. No language interpreter was used.   Jo Johnson is a 86 y.o. female who presents to the Emergency Department complaining of cough, COVID positive. Level V caveat due to dementia. History is provided by the patient's son. The patient's son brought her to her doctor's office at [facility] for three days of worsening cough. She also has been experiencing increased lower extremity edema. At [facility] she had a rapid COBIT test performed that was positive and they recommended bringing her to the emergency department. He states that she does have a chronic cough but this work cough is significantly worse from her baseline. He also reports decreased appetite. They reports of fevers, vomiting. She did have mild diarrhea, which is now resolved. She has advanced Alzheimer's and resides at home with her son and his wife. He was recently ill with upper respiratory symptoms but tested negative for COVID twice. She is minimally verbal at baseline and ambulates with a walker although she is unsteady and needs assistance per son. He recently discontinued one of her home medications thinking that her leg swelling would improve after the medication change.    Past Medical History:  Diagnosis Date  . Acid reflux   . Asthma   . Chronic cough   . Coronary artery disease    2 vessel CAD with normal LV function status post left circumflex stenting with a drug-eluting stent by Dr. Glenetta Hew  . Hyperlipidemia   . Hypertension   . Myocardial infarction [facility])     Patient Active Problem List   Diagnosis Date Noted  . Acute respiratory failure with hypoxia ([place]) 03/09/2019  .  Hyperlipidemia 10/16/2012  . CAD (coronary artery disease) 07/01/2012  . NSTEMI - S/P PCI + DES to Prox Circumflex 06/23/2012  . Unstable angina ([place]) 06/21/2012  . INTRINSIC ASTHMA, WITH EXACERBATION 06/22/2008  . Intrinsic asthma 12/25/2007  . Essential hypertension 12/04/2007  . Cough 12/04/2007    Past Surgical History:  Procedure Laterality Date  . APPENDECTOMY    . CHOLECYSTECTOMY    . EYE SURGERY    . LEFT HEART CATHETERIZATION WITH CORONARY ANGIOGRAM N/A 06/22/2012   Procedure: LEFT HEART CATHETERIZATION WITH CORONARY ANGIOGRAM;  Surgeon: Leonie Man, MD;  Location: [facility] CATH LAB;  Service: Cardiovascular;  Laterality: N/A;  . PERCUTANEOUS CORONARY STENT INTERVENTION (PCI-S)  06/22/2012   Procedure: PERCUTANEOUS CORONARY STENT INTERVENTION (PCI-S);  Surgeon: Leonie Man, MD;  Location: [facility] CATH LAB;  Service: Cardiovascular;;  . surgery to legs d/t trauma       OB History   No obstetric history on file.     Family History  Problem Relation Age of Onset  . CVA Mother   . Cancer Father   . Dementia Brother   . COPD Brother     Social History   Tobacco Use  . Smoking status: Never Smoker  . Smokeless tobacco: Current User    Types: Snuff  Substance Use Topics  . Alcohol use: No  . Drug use: No    Home Medications Prior to Admission medications   Medication Sig Start Date End Date Taking? Authorizing Provider  acetaminophen (TYLENOL) 500 MG tablet Take 500-1,000 mg by mouth every 6 (six) hours as needed for mild pain.   Yes [provider]  aspirin EC 81 MG EC tablet Take 1 tablet (81 mg total) by mouth daily. 06/23/12  Yes Simmons, Brittainy M, PA-C  atorvastatin (LIPITOR) 10 MG tablet TAKE 1 TABLET BY MOUTH DAILY AT 6 PM Patient taking differently: Take 10 mg by mouth daily.  12/10/12  Yes Runell Gess, MD  cephALEXin (KEFLEX) 250 MG capsule Take 250 mg by mouth daily.   Yes [provider]  Cholecalciferol (VITAMIN D-3) 1000 units  CAPS Take 1,000 Units by mouth daily.    Yes [provider]  guaiFENesin (ROBITUSSIN) 100 MG/5ML SOLN Take 5 mLs by mouth every 4 (four) hours as needed for cough or to loosen phlegm.   Yes [provider]  ipratropium (ATROVENT) 0.03 % nasal spray SMARTSIG:1-2 Spray(s) Both Nares 1 to 3 Times Daily PRN 03/07/19  Yes [provider]  isosorbide mononitrate (IMDUR) 30 MG 24 hr tablet TAKE 1/2 TABLET BY MOUTH DAILY. KEEP UPCOMING APPOINTMENT FOR FUTURE REFILLS. Patient taking differently: Take 15 mg by mouth daily.  01/26/19  Yes Runell Gess, MD  metoprolol tartrate (LOPRESSOR) 25 MG tablet Take 0.5 tablets (12.5 mg total) by mouth 2 (two) times daily. 03/04/19  Yes Runell Gess, MD  Multiple Vitamins-Minerals (VISION FORMULA EYE HEALTH PO) Take 1 tablet by mouth in the morning and at bedtime.    Yes [provider]  nitroGLYCERIN (NITROSTAT) 0.4 MG SL tablet Place 1 tablet (0.4 mg total) under the tongue every 5 (five) minutes x 3 doses as needed for chest pain. 03/16/14  Yes Runell Gess, MD  pantoprazole (PROTONIX) 40 MG tablet TAKE 1 TABLET BY MOUTH EVERY DAY Patient taking differently: Take 40 mg by mouth daily.  03/10/17  Yes Runell Gess, MD  risperiDONE (RISPERDAL M-TABS) 1 MG disintegrating tablet Place 1 mg under the tongue See admin instructions. Take 1 mg at lunch time and 2 mg in the evening at 6:00 PM. 10/01/18  Yes [provider]  temazepam (RESTORIL) 7.5 MG capsule Take 7.5 mg by mouth at bedtime. 02/16/19  Yes [provider]  vitamin B-12 (CYANOCOBALAMIN) 250 MCG tablet Take 250 mcg by mouth daily.   Yes [provider]    Allergies    Patient has no known allergies.  Review of Systems   Review of Systems  All other systems reviewed and are negative.   Physical Exam Updated Vital Signs BP (!) 151/86   Pulse 71   Temp 99.6 F (37.6 C) (Oral)   Resp (!) 30   SpO2 94%   Physical Exam Vitals and  nursing note reviewed.  Constitutional:      Appearance: She is well-developed.  HENT:     Head: Normocephalic and atraumatic.  Cardiovascular:     Rate and Rhythm: Normal rate and regular rhythm.     Heart sounds: No murmur.  Pulmonary:     Effort: Pulmonary effort is normal. No respiratory distress.     Breath sounds: Normal breath sounds.  Abdominal:     Palpations: Abdomen is soft.     Tenderness: There is no abdominal tenderness. There is no guarding or rebound.  Musculoskeletal:        General: No tenderness.     Comments: One plus pitting edema to bilateral lower extremities  Skin:    General: Skin is warm and dry.  Neurological:     Mental Status: She is alert.     Comments: Nonverbal. Moves all extremities symmetrically and does follow basic commands.  Psychiatric:        Behavior: Behavior normal.     ED Results / Procedures / Treatments   Labs (all labs ordered are listed, but only abnormal results are displayed) Labs Reviewed  CBC WITH DIFFERENTIAL/PLATELET - Abnormal; Notable for the following components:      Result Value   WBC 2.2 (*)    RBC 3.73 (*)    Hemoglobin 11.3 (*)    Platelets 93 (*)    Neutro Abs 1.3 (*)    Lymphs Abs 0.6 (*)    All other components within normal limits  COMPREHENSIVE METABOLIC PANEL - Abnormal; Notable for the following components:   Glucose, Bld 115 (*)    Calcium 8.3 (*)    Total Protein 5.6 (*)    Albumin 3.1 (*)    Total Bilirubin 0.1 (*)    All other components within normal limits  D-DIMER, QUANTITATIVE (NOT AT [facility]) - Abnormal; Notable for the following components:   D-Dimer, Quant 2.65 (*)    All other components within normal limits  POC SARS CORONAVIRUS 2 AG -  ED - Abnormal; Notable for the following components:   SARS Coronavirus 2 Ag POSITIVE (*)    All other components within normal limits  CULTURE, BLOOD (ROUTINE X 2)  CULTURE, BLOOD (ROUTINE X 2)  LACTIC ACID, PLASMA  PROCALCITONIN  LACTATE  DEHYDROGENASE  FERRITIN  TRIGLYCERIDES  FIBRINOGEN  C-REACTIVE PROTEIN  BRAIN NATRIURETIC PEPTIDE    EKG EKG Interpretation  Date/Time:  Tuesday March 09 2019 17:34:38 EST Ventricular Rate:  74 PR Interval:    QRS Duration: 79 QT Interval:  385 QTC Calculation: 447 R Axis:   66 Text Interpretation: undetermined rhythm Probable anteroseptal infarct, recent Lateral leads are also involved Interpretation limited secondary to artifact Confirmed by Tilden Fossa [phone]) on 03/09/2019 5:54:37 PM   Radiology CT Angio Chest PE W/Cm &/Or Wo Cm  Result Date: 03/09/2019 CLINICAL DATA:  Shortness of breath, lower extremity edema, COVID-19 EXAM: CT ANGIOGRAPHY CHEST WITH CONTRAST TECHNIQUE: Multidetector CT imaging of the chest was performed using the standard protocol during bolus administration of intravenous contrast. Multiplanar CT image reconstructions and MIPs were obtained to evaluate the vascular anatomy. CONTRAST:  25mL OMNIPAQUE IOHEXOL 350 MG/ML SOLN COMPARISON:  None. FINDINGS: Cardiovascular: Contrast injection is sufficient to demonstrate satisfactory opacification of the pulmonary arteries to the segmental level. There is no pulmonary embolus or evidence of right heart strain. The size of the main pulmonary artery is normal. Heart size is normal, with no pericardial effusion. The course and caliber of the aorta are normal. There is mild atherosclerotic calcification. Opacification decreased due to pulmonary arterial phase contrast bolus timing. Mediastinum/Nodes: No mediastinal, hilar or axillary lymphadenopathy. The visualized thyroid and thoracic esophageal course are unremarkable. Lungs/Pleura: Airways are patent. No focal consolidation, pulmonary infarct or pleural effusion. Right basilar atelectasis. Upper Abdomen: Contrast bolus timing is not optimized for evaluation of the abdominal organs. The visualized portions of the organs of the upper abdomen are normal. Musculoskeletal: No  chest wall abnormality. No bony spinal canal stenosis. Review of the MIP images confirms the above findings. IMPRESSION: 1. No pulmonary embolus. 2. No acute thoracic abnormality. Aortic Atherosclerosis (ICD10-I70.0). Electronically Signed   By: Deatra Robinson M.D.   On: 03/09/2019 21:32   DG Chest Port 1 View  Result Date: 03/09/2019  CLINICAL DATA:  Short of breath and cough EXAM: PORTABLE CHEST 1 VIEW COMPARISON:  11/07/2018 FINDINGS: Elevated right hemidiaphragm unchanged with right lower lobe atelectasis. COPD with pulmonary hyperinflation. No acute infiltrate or effusion. Negative for heart failure. Atherosclerotic aortic arch. IMPRESSION: No acute abnormality. Chronic elevation right hemidiaphragm with right lower lobe atelectasis. Electronically Signed   By: Marlan Palau M.D.   On: 03/09/2019 17:44    Procedures Procedures (including critical care time)  Medications Ordered in ED Medications  sodium chloride (PF) 0.9 % injection (has no administration in time range)  benzonatate (TESSALON) capsule 100 mg (100 mg Oral Given 03/09/19 1808)  risperiDONE (RISPERDAL) tablet 1 mg (1 mg Oral Given 03/09/19 2027)  iohexol (OMNIPAQUE) 350 MG/ML injection 100 mL (80 mLs Intravenous Contrast Given 03/09/19 2101)  risperiDONE (RISPERDAL) tablet 1 mg (1 mg Oral Given 03/09/19 2215)    ED Course  I have reviewed the triage vital signs and the nursing notes.  Pertinent labs & imaging results that were available during my care of the patient were reviewed by me and considered in my medical decision making (see chart for details).    MDM Rules/Calculators/A&P                     Patient brought in for cough, shortness of breath, COVID-19 diagnosed in the outpatient setting. Unable to obtain records regarding her positive study. Will repeat the study here. She has tachypnea but no respiratory distress on evaluation. Lungs are clear. She does have lower extremity edema, right slightly greater than left  and a D dimer was obtained. D dimer is elevated and CTA was obtained, which is negative for PE. Given patient's progressive weakness, tachypnea an advanced age plan to admit for observation. Hospitalist consulted for admission.  Final Clinical Impression(s) / ED Diagnoses Final diagnoses:  COVID-19 virus infection    Rx / DC Orders ED Discharge Orders    None       Tilden Fossa, MD 03/10/19 0000

## 2019-03-09 NOTE — ED Notes (Signed)
Pt call light activated by her son. Pt's son can be heard through the door yelling at pt to keep her mask on and to be quiet while he is on the phone. Pt's son states that she keeps pulling the stickers and wires off of her and trying to pull her IV out. Pt had Coban placed around both IV sites. While in the room the pt's son continued to yell at the pt about her mask. Pt's son inquires about being tested while he is here with the pt and he was advised that he would have to check in and be seen by a doctor first. Son then makes comment about not having his Xanax. Son then states " I am going to call my doctor and see if he can call your doctor here and y'all can give me my Xanax." Son was again made aware that he cannot do that because he would have to be seen as a pt.

## 2019-03-09 NOTE — ED Notes (Signed)
Pharm tech requested to come to room for med review.

## 2019-03-10 ENCOUNTER — Observation Stay (HOSPITAL_BASED_OUTPATIENT_CLINIC_OR_DEPARTMENT_OTHER): Payer: Medicare Other

## 2019-03-10 DIAGNOSIS — U071 COVID-19: Secondary | ICD-10-CM | POA: Diagnosis not present

## 2019-03-10 DIAGNOSIS — J45909 Unspecified asthma, uncomplicated: Secondary | ICD-10-CM

## 2019-03-10 DIAGNOSIS — R7989 Other specified abnormal findings of blood chemistry: Secondary | ICD-10-CM | POA: Diagnosis not present

## 2019-03-10 DIAGNOSIS — R609 Edema, unspecified: Secondary | ICD-10-CM

## 2019-03-10 DIAGNOSIS — I10 Essential (primary) hypertension: Secondary | ICD-10-CM | POA: Diagnosis not present

## 2019-03-10 DIAGNOSIS — F0281 Dementia in other diseases classified elsewhere with behavioral disturbance: Secondary | ICD-10-CM | POA: Diagnosis not present

## 2019-03-10 DIAGNOSIS — I2510 Atherosclerotic heart disease of native coronary artery without angina pectoris: Secondary | ICD-10-CM

## 2019-03-10 DIAGNOSIS — R0600 Dyspnea, unspecified: Principal | ICD-10-CM

## 2019-03-10 DIAGNOSIS — D72819 Decreased white blood cell count, unspecified: Secondary | ICD-10-CM | POA: Diagnosis not present

## 2019-03-10 DIAGNOSIS — R05 Cough: Secondary | ICD-10-CM | POA: Diagnosis not present

## 2019-03-10 DIAGNOSIS — R600 Localized edema: Secondary | ICD-10-CM | POA: Diagnosis not present

## 2019-03-10 DIAGNOSIS — Z66 Do not resuscitate: Secondary | ICD-10-CM | POA: Diagnosis not present

## 2019-03-10 DIAGNOSIS — G309 Alzheimer's disease, unspecified: Secondary | ICD-10-CM | POA: Diagnosis not present

## 2019-03-10 DIAGNOSIS — D61818 Other pancytopenia: Secondary | ICD-10-CM | POA: Diagnosis not present

## 2019-03-10 LAB — CBC
HCT: 36.8 % (ref 36.0–46.0)
HCT: 34.2 % — ABNORMAL LOW (ref 36.0–46.0)
Hemoglobin: 12 g/dL (ref 12.0–15.0)
Hemoglobin: 11.4 g/dL — ABNORMAL LOW (ref 12.0–15.0)
MCH: 30.8 pg (ref 26.0–34.0)
MCH: 30.6 pg (ref 26.0–34.0)
MCHC: 32.6 g/dL (ref 30.0–36.0)
MCHC: 33.3 g/dL (ref 30.0–36.0)
MCV: 94.6 fL (ref 80.0–100.0)
MCV: 91.7 fL (ref 80.0–100.0)
Platelets: 66 10*3/uL — ABNORMAL LOW (ref 150–400)
Platelets: 84 10*3/uL — ABNORMAL LOW (ref 150–400)
RBC: 3.89 MIL/uL (ref 3.87–5.11)
RBC: 3.73 MIL/uL — ABNORMAL LOW (ref 3.87–5.11)
RDW: 13 % (ref 11.5–15.5)
RDW: 13 % (ref 11.5–15.5)
WBC: 2 10*3/uL — ABNORMAL LOW (ref 4.0–10.5)
WBC: 2 10*3/uL — ABNORMAL LOW (ref 4.0–10.5)
nRBC: 0 % (ref 0.0–0.2)
nRBC: 0 % (ref 0.0–0.2)

## 2019-03-10 LAB — BASIC METABOLIC PANEL
Anion gap: 11 (ref 5–15)
BUN: 16 mg/dL (ref 8–23)
CO2: 26 mmol/L (ref 22–32)
Calcium: 8.3 mg/dL — ABNORMAL LOW (ref 8.9–10.3)
Chloride: 105 mmol/L (ref 98–111)
Creatinine, Ser: 0.82 mg/dL (ref 0.44–1.00)
GFR calc Af Amer: >60 mL/min (ref >60)
GFR calc non Af Amer: >60 mL/min (ref >60)
Glucose, Bld: 103 mg/dL — ABNORMAL HIGH (ref 70–99)
Potassium: 4.4 mmol/L (ref 3.5–5.1)
Sodium: 142 mmol/L (ref 135–145)

## 2019-03-10 LAB — C-REACTIVE PROTEIN: CRP: 0.6 mg/dL (ref <1.0)

## 2019-03-10 LAB — FERRITIN: Ferritin: 130 ng/mL (ref 11–307)

## 2019-03-10 MED ORDER — FLUTICASONE PROPIONATE 50 MCG/ACT NA SUSP
1.0000 | Freq: Every day | NASAL | Status: DC
  Administered 2019-03-10: 1 via NASAL
  Filled 2019-03-10: qty 16

## 2019-03-10 MED ORDER — CYANOCOBALAMIN 500 MCG PO TABS
250.0000 ug | ORAL_TABLET | Freq: Every day | ORAL | Status: DC
  Administered 2019-03-10: 09:00:00 250 ug via ORAL
  Filled 2019-03-10: qty 1

## 2019-03-10 MED ORDER — PANTOPRAZOLE SODIUM 40 MG PO TBEC
40.0000 mg | DELAYED_RELEASE_TABLET | Freq: Every day | ORAL | Status: DC
  Administered 2019-03-10: 40 mg via ORAL
  Filled 2019-03-10: qty 1

## 2019-03-10 MED ORDER — FLUTICASONE PROPIONATE 50 MCG/ACT NA SUSP: 1.0000 | Freq: Every day | NASAL | 2 refills | Status: AC

## 2019-03-10 MED ORDER — ATORVASTATIN CALCIUM 10 MG PO TABS: 10.0000 mg | ORAL_TABLET | Freq: Every day | ORAL | Status: DC

## 2019-03-10 MED ORDER — ONDANSETRON HCL 4 MG/2ML IJ SOLN: 4.0000 mg | Freq: Four times a day (QID) | INTRAMUSCULAR | Status: DC | PRN

## 2019-03-10 MED ORDER — FLUTICASONE PROPIONATE HFA 110 MCG/ACT IN AERO: 2.0000 | INHALATION_SPRAY | Freq: Two times a day (BID) | RESPIRATORY_TRACT | 2 refills | Status: AC

## 2019-03-10 MED ORDER — RISPERIDONE 1 MG PO TBDP
2.0000 mg | ORAL_TABLET | Freq: Every day | ORAL | Status: DC
  Filled 2019-03-10: qty 2

## 2019-03-10 MED ORDER — VITAMIN D3 25 MCG (1000 UNIT) PO TABS
1000.0000 [IU] | ORAL_TABLET | Freq: Every day | ORAL | Status: DC
  Administered 2019-03-10: 1000 [IU] via ORAL
  Filled 2019-03-10: qty 1

## 2019-03-10 MED ORDER — ASPIRIN 81 MG PO TBEC: 81.0000 mg | DELAYED_RELEASE_TABLET | Freq: Every day | ORAL | Status: AC

## 2019-03-10 MED ORDER — NITROGLYCERIN 0.4 MG SL SUBL: 0.4000 mg | SUBLINGUAL_TABLET | SUBLINGUAL | Status: DC | PRN

## 2019-03-10 MED ORDER — ACETAMINOPHEN 650 MG RE SUPP: 650.0000 mg | Freq: Four times a day (QID) | RECTAL | Status: DC | PRN

## 2019-03-10 MED ORDER — RISPERIDONE 1 MG PO TBDP
1.0000 mg | ORAL_TABLET | Freq: Every day | ORAL | Status: DC
  Administered 2019-03-10: 13:00:00 1 mg via SUBLINGUAL
  Filled 2019-03-10: qty 1

## 2019-03-10 MED ORDER — ISOSORBIDE MONONITRATE ER 30 MG PO TB24
15.0000 mg | ORAL_TABLET | Freq: Every day | ORAL | Status: DC
  Administered 2019-03-10: 15 mg via ORAL
  Filled 2019-03-10: qty 1

## 2019-03-10 MED ORDER — ASPIRIN EC 81 MG PO TBEC
81.0000 mg | DELAYED_RELEASE_TABLET | Freq: Every day | ORAL | Status: DC
  Administered 2019-03-10: 09:00:00 81 mg via ORAL
  Filled 2019-03-10: qty 1

## 2019-03-10 MED ORDER — METOPROLOL TARTRATE 25 MG PO TABS
12.5000 mg | ORAL_TABLET | Freq: Two times a day (BID) | ORAL | Status: DC
  Administered 2019-03-10 (×2): 12.5 mg via ORAL
  Filled 2019-03-10 (×2): qty 1

## 2019-03-10 MED ORDER — ACETAMINOPHEN 325 MG PO TABS: 650.0000 mg | ORAL_TABLET | Freq: Four times a day (QID) | ORAL | Status: DC | PRN

## 2019-03-10 MED ORDER — ONDANSETRON HCL 4 MG PO TABS: 4.0000 mg | ORAL_TABLET | Freq: Four times a day (QID) | ORAL | Status: DC | PRN

## 2019-03-10 MED ORDER — ENOXAPARIN SODIUM 40 MG/0.4ML ~~LOC~~ SOLN
40.0000 mg | Freq: Every day | SUBCUTANEOUS | Status: DC
  Administered 2019-03-10: 40 mg via SUBCUTANEOUS
  Filled 2019-03-10: qty 0.4

## 2019-03-10 MED ORDER — ENSURE ENLIVE PO LIQD
237.0000 mL | Freq: Two times a day (BID) | ORAL | Status: DC
  Administered 2019-03-10: 10:00:00 237 mL via ORAL

## 2019-03-10 MED ORDER — GUAIFENESIN 100 MG/5ML PO SOLN: 5.0000 mL | ORAL | Status: DC | PRN

## 2019-03-10 NOTE — Discharge Summary (Signed)
Physician Discharge Summary  Jo Johnson KDX:[id number] DOB: 03-Dec-1924 DOA: 03/09/2019  PCP: Mardella Layman, MD  Admit date: 03/09/2019 Discharge date: 03/10/2019  Admitted From: Home  Discharge disposition: Home  Recommendations for Outpatient Follow-Up:   . Follow up with your primary care provider in one week.  . Check CBC, BMP in the next visit. . Thrombocytopenia and leukopenia likely from viral infection.  This will need to be checked in the next visit. . Could benefit from addition of low-dose hydrochlorothiazide if continues to have leg swelling. . Patient has chronic cough and has been started on Flonase spray and Flovent inhaler.  Continue PPI.   Discharge Diagnosis:   Principal Problem:   Acute dyspnea Active Problems:   Essential hypertension   Intrinsic asthma   Cough   CAD (coronary artery disease)   Discharge Condition: Stable  Diet recommendation: Low sodium, heart healthy.    Wound care: None.  Code status: DNR   History of Present Illness:   Jo DUDEK is a 86 y.o. female with history of advanced dementia, CAD, hyperlipidemia, hypertension presented to hospital with cough and tachypnea.  Cough was nonproductive without chest pain.  Patient had gone to her PCPs office today with patient's son and was diagnosed with Covid positive.  Given the patient had persistent cough with tachypnea was advised to come to the ER.  ED Course: In the ER patient was not hypoxic but was mildly tachypneic with persistent cough.  D-dimer was elevated. CT angiogram was negative for any PE or pneumonia.  Inflammatory markers are negative except for D-dimer.  Covid test was positive.  On exam, patient had bilateral lower extremity edema.  Labs show pancytopenia with WBC count of 2.2 hemoglobin 11.3 platelets 93 which appear to be new.  BNP of 62.5.  Given the persistent cough with tachypnea and newly diagnosed Covid infection with lower extremity edema, patient was  placed in observation.  Hospital Course:   Following conditions were addressed during hospitalization as listed below,  COVID-19 positive, concern for possible dyspnea and chronic cough.  Patient does have a runny nose and this is likely secondary to postnasal drip.  Flonase drip will be prescribed.  Patient was supposed to be on inhalers but has been difficult due to history of dementia.  I have put the patient  on Flovent inhaler as well.  Patient is afebrile and inflammatory markers are negative except for mildly elevated D-dimer.  CT angiogram of the chest was negative for PE.  DVT scan of the lower extremity was negative for PE as well.  ASymptomatic Covid at this time, patient was not given remdesivir dexamethasone in the hospital.  At this time patient is saturating 98% on room air.   Bilateral lower extremity edema with normal BNP.  No clinical concern for congestive heart failure. Patient Is mostly bedbound and could be positional edema..  CT angiogram of the chest was negative.  Duplex ultrasound was negative as well.  Albumin was low at 3.1 as well . Leukopenia, thrombocytopenia appears to be new.  Likely from viral infection.  LFTs within normal limits.  No evidence of bleeding.  WBC at 2.0.  Hemoglobin 11.4.  Platelets of 84.  This will need to be followed by PCP as outpatient.  History of CAD.  On aspirin statins and beta-blockers.  This will be continued.  EKG was unremarkable.    Essential hypertension on beta-blockers and Imdur.  Dementia with behavioral changes.  Presently on Risperdal  Disposition.  At this time, patient is stable for disposition. Spoke with the son in detail.  Advised isolation at home  Medical Consultants:    None.  Procedures:    None Subjective:   Today, patient denies dyspnea chest pain but is poor historian.  Has cough.  Complains of pain on compression of the legs.  Discharge Exam:   Vitals:   03/10/19 0948 03/10/19 1229  BP: (!)  161/77   Pulse: 85   Resp:    Temp: 98.5 F (36.9 C)   SpO2: 94% 98%   Vitals:   03/10/19 0537 03/10/19 0544 03/10/19 0948 03/10/19 1229  BP: (!) 148/78  (!) 161/77   Pulse: 74  85   Resp: (!) 24     Temp: 97.7 F (36.5 C)  98.5 F (36.9 C)   TempSrc: Axillary  Axillary   SpO2: (!) 88% 96% 94% 98%  Weight:      Height:       General: Alert awake, not in obvious distress HENT: pupils equally reacting to light,  No scleral pallor or icterus noted. Oral mucosa is moist.  Chest:  Clear breath sounds.  Diminished breath sounds bilaterally. No crackles or wheezes.  CVS: S1 &S2 heard. No murmur.  Regular rate and rhythm. Abdomen: Soft, nontender, nondistended.  Bowel sounds are heard.   Extremities: No cyanosis, clubbing but lower extremity trace edema noted, more on the left.  Peripheral pulses are palpable. Psych: Alert, awake and oriented, normal mood CNS:  No cranial nerve deficits.  Power equal in all extremities.   Skin: Warm and dry.  No rashes noted.  The results of significant diagnostics from this hospitalization (including imaging, microbiology, ancillary and laboratory) are listed below for reference.     Diagnostic Studies:   CT Angio Chest PE W/Cm &/Or Wo Cm  Result Date: 03/09/2019 CLINICAL DATA:  Shortness of breath, lower extremity edema, COVID-19 EXAM: CT ANGIOGRAPHY CHEST WITH CONTRAST TECHNIQUE: Multidetector CT imaging of the chest was performed using the standard protocol during bolus administration of intravenous contrast. Multiplanar CT image reconstructions and MIPs were obtained to evaluate the vascular anatomy. CONTRAST:  80mL OMNIPAQUE IOHEXOL 350 MG/ML SOLN COMPARISON:  None. FINDINGS: Cardiovascular: Contrast injection is sufficient to demonstrate satisfactory opacification of the pulmonary arteries to the segmental level. There is no pulmonary embolus or evidence of right heart strain. The size of the main pulmonary artery is normal. Heart size is normal,  with no pericardial effusion. The course and caliber of the aorta are normal. There is mild atherosclerotic calcification. Opacification decreased due to pulmonary arterial phase contrast bolus timing. Mediastinum/Nodes: No mediastinal, hilar or axillary lymphadenopathy. The visualized thyroid and thoracic esophageal course are unremarkable. Lungs/Pleura: Airways are patent. No focal consolidation, pulmonary infarct or pleural effusion. Right basilar atelectasis. Upper Abdomen: Contrast bolus timing is not optimized for evaluation of the abdominal organs. The visualized portions of the organs of the upper abdomen are normal. Musculoskeletal: No chest wall abnormality. No bony spinal canal stenosis. Review of the MIP images confirms the above findings. IMPRESSION: 1. No pulmonary embolus. 2. No acute thoracic abnormality. Aortic Atherosclerosis (ICD10-I70.0). Electronically Signed   By: Deatra RobinsonKevin  Herman M.D.   On: 03/09/2019 21:32   DG Chest Port 1 View  Result Date: 03/09/2019 CLINICAL DATA:  Short of breath and cough EXAM: PORTABLE CHEST 1 VIEW COMPARISON:  11/07/2018 FINDINGS: Elevated right hemidiaphragm unchanged with right lower lobe atelectasis. COPD with pulmonary hyperinflation. No acute infiltrate or effusion. Negative for  heart failure. Atherosclerotic aortic arch. IMPRESSION: No acute abnormality. Chronic elevation right hemidiaphragm with right lower lobe atelectasis. Electronically Signed   By: Marlan Palauharles  Clark M.D.   On: 03/09/2019 17:44   VAS US LOWER EXTREMITY VENOUS (DVT)  Result Date: 03/10/2019  Lower Venous DVTStudy Indications: Edema. Other Indications: Covid+. Limitations: Poor ultrasound/tissue interface and dementia, limbs are contracted. Comparison Study: No prior exam. Performing Technologist: Kennedy BuckerKristy Siebrecht ARDMS, RVT  Examination Guidelines: A complete evaluation includes B-mode imaging, spectral Doppler, color Doppler, and power Doppler as needed of all accessible portions of each  vessel. Bilateral testing is considered an integral part of a complete examination. Limited examinations for reoccurring indications may be performed as noted. The reflux portion of the exam is performed with the patient in reverse Trendelenburg.  +---------+---------------+---------+-----------+----------+-----------------+ RIGHT    CompressibilityPhasicitySpontaneityPropertiesThrombus Aging    +---------+---------------+---------+-----------+----------+-----------------+ CFV      Full           Yes      Yes                                    +---------+---------------+---------+-----------+----------+-----------------+ SFJ      Full                                                           +---------+---------------+---------+-----------+----------+-----------------+ FV Prox  Full                                                           +---------+---------------+---------+-----------+----------+-----------------+ FV Mid   Full                                                           +---------+---------------+---------+-----------+----------+-----------------+ FV DistalFull                                                           +---------+---------------+---------+-----------+----------+-----------------+ PFV      Full                                                           +---------+---------------+---------+-----------+----------+-----------------+ POP      Full           Yes      Yes                                    +---------+---------------+---------+-----------+----------+-----------------+ PTV      Full  poorly visualized +---------+---------------+---------+-----------+----------+-----------------+ PERO     Full                                         poorly visualized +---------+---------------+---------+-----------+----------+-----------------+    +---------+---------------+---------+-----------+----------+-----------------+ LEFT     CompressibilityPhasicitySpontaneityPropertiesThrombus Aging    +---------+---------------+---------+-----------+----------+-----------------+ CFV      Full           Yes      Yes                                    +---------+---------------+---------+-----------+----------+-----------------+ SFJ      Full                                                           +---------+---------------+---------+-----------+----------+-----------------+ FV Prox  Full                                                           +---------+---------------+---------+-----------+----------+-----------------+ FV Mid   Full                                                           +---------+---------------+---------+-----------+----------+-----------------+ FV DistalFull                                                           +---------+---------------+---------+-----------+----------+-----------------+ PFV      Full                                                           +---------+---------------+---------+-----------+----------+-----------------+ POP      Full           Yes      Yes                                    +---------+---------------+---------+-----------+----------+-----------------+ PTV      Full                                         poorly visualized +---------+---------------+---------+-----------+----------+-----------------+ PERO     Full  poorly visualized +---------+---------------+---------+-----------+----------+-----------------+ Poorly visualized bilateral calf veins due to limbs contracted.    Summary: BILATERAL: - No evidence of deep vein thrombosis seen in the lower extremities, bilaterally.  RIGHT: - No cystic structure found in the popliteal fossa.  LEFT: - No cystic structure found in the popliteal fossa.   *See table(s) above for measurements and observations.    Preliminary      Labs:   Basic Metabolic Panel: Recent Labs  Lab 03/09/19 1802 03/10/19 0247  NA 140 142  K 4.0 4.4  CL 103 105  CO2 30 26  GLUCOSE 115* 103*  BUN 20 16  CREATININE 0.75 0.82  CALCIUM 8.3* 8.3*   GFR Estimated Creatinine Clearance: 36.2 mL/min (by C-G formula based on SCr of 0.82 mg/dL). Liver Function Tests: Recent Labs  Lab 03/09/19 1802  AST 22  ALT 14  ALKPHOS 55  BILITOT 0.1*  PROT 5.6*  ALBUMIN 3.1*   No results for input(s): LIPASE, AMYLASE in the last 168 hours. No results for input(s): AMMONIA in the last 168 hours. Coagulation profile No results for input(s): INR, PROTIME in the last 168 hours.  CBC: Recent Labs  Lab 03/09/19 1802 03/10/19 0247  WBC 2.2* 2.0*  2.0*  NEUTROABS 1.3*  --   HGB 11.3* 11.4*  12.0  HCT 36.1 34.2*  36.8  MCV 96.8 91.7  94.6  PLT 93* 84*  66*   Cardiac Enzymes: No results for input(s): CKTOTAL, CKMB, CKMBINDEX, TROPONINI in the last 168 hours. BNP: Invalid input(s): POCBNP CBG: No results for input(s): GLUCAP in the last 168 hours. D-Dimer Recent Labs    03/09/19 1802  DDIMER 2.65*   Hgb A1c No results for input(s): HGBA1C in the last 72 hours. Lipid Profile Recent Labs    03/09/19 1802  TRIG 91   Thyroid function studies No results for input(s): TSH, T4TOTAL, T3FREE, THYROIDAB in the last 72 hours.  Invalid input(s): FREET3 Anemia work up Recent Labs    03/09/19 1800 03/10/19 0750  FERRITIN 114 130   Microbiology Recent Results (from the past 240 hour(s))  Blood Culture (routine x 2)     Status: None (Preliminary result)   Collection Time: 03/09/19  6:00 PM   Specimen: BLOOD  Result Value Ref Range Status   Specimen Description   Final    BLOOD RIGHT ANTECUBITAL Performed at [facility], [address] [place], [place] [address]    Special Requests   Final    BOTTLES DRAWN AEROBIC AND  ANAEROBIC Blood Culture results may not be optimal due to an excessive volume of blood received in culture bottles Performed at [facility], [address] [place], [place] [address]    Culture   Final    NO GROWTH < 12 HOURS Performed at [facility], [address], [place], [place] [address]    Report Status PENDING  Incomplete  Blood Culture (routine x 2)     Status: None (Preliminary result)   Collection Time: 03/09/19  6:02 PM   Specimen: BLOOD RIGHT HAND  Result Value Ref Range Status   Specimen Description   Final    BLOOD RIGHT HAND Performed at [facility], [address] [place], [place] [address]    Special Requests   Final    BOTTLES DRAWN AEROBIC AND ANAEROBIC Blood Culture results may not be optimal due to an inadequate volume of blood received in culture bottles Performed at [facility], [address] [place], [place] [address]    Culture   Final    NO GROWTH < 12 HOURS Performed at [facility], [address], [place], [place] [address]    Report Status PENDING  Incomplete     Discharge Instructions:   Discharge Instructions    Diet - low sodium heart healthy   Complete by: As directed    Discharge instructions   Complete by: As directed    Follow-up with your primary care physician in 1 week.  Check CBC BMP at that time.  Please seek medical attention for worsening symptoms including increasing shortness of breath, low oxygen, fever, bleeding.  Take Tylenol as needed for mild pain and fever.  You have been prescribed nasal spray and inhaler for cough and breathing.   Increase activity slowly   Complete by: As directed    MyChart COVID-19 home monitoring program   Complete by: Mar 10, 2019    Is the patient willing to use the MyChart Mobile App for home monitoring?: No     Allergies as of 03/10/2019   No Known Allergies     Medication List    TAKE these medications     acetaminophen 500 MG tablet Commonly known as: TYLENOL Take 500-1,000 mg by mouth every 6 (six) hours as needed for mild pain.   aspirin 81 MG EC tablet Take 1 tablet (81 mg total) by mouth daily. Start taking on: March 13, 2019 What changed: These instructions start on March 13, 2019. If you are unsure what to do until then, ask your doctor or other care provider.   atorvastatin 10 MG tablet Commonly known as: LIPITOR TAKE 1 TABLET BY MOUTH DAILY AT 6 PM What changed: See the new instructions.   cephALEXin 250 MG capsule Commonly known as: KEFLEX Take 250 mg by mouth daily.   fluticasone 110 MCG/ACT inhaler Commonly known as: FLOVENT HFA Inhale 2 puffs into the lungs 2 (two) times daily.   fluticasone 50 MCG/ACT nasal spray Commonly known as: FLONASE Place 1 spray into both nostrils daily. Start taking on: March 11, 2019   guaiFENesin 100 MG/5ML Soln Commonly known as: ROBITUSSIN Take 5 mLs by mouth every 4 (four) hours as needed for cough or to loosen phlegm.   ipratropium 0.03 % nasal spray Commonly known as: ATROVENT SMARTSIG:1-2 Spray(s) Both Nares 1 to 3 Times Daily PRN   isosorbide mononitrate 30 MG 24 hr tablet Commonly known as: IMDUR TAKE 1/2 TABLET BY MOUTH DAILY. KEEP UPCOMING APPOINTMENT FOR FUTURE REFILLS. What changed: See the new instructions.   metoprolol tartrate 25 MG tablet Commonly known as: LOPRESSOR Take 0.5 tablets (12.5 mg total) by mouth 2 (two) times daily.   nitroGLYCERIN 0.4 MG SL tablet Commonly known as: NITROSTAT Place 1 tablet (0.4 mg total) under the tongue every 5 (five) minutes x 3 doses as needed for chest pain.   pantoprazole 40 MG tablet Commonly known as: PROTONIX TAKE 1 TABLET BY MOUTH EVERY DAY   risperiDONE 1 MG disintegrating tablet Commonly known as: RISPERDAL M-TABS Place 1 mg under the tongue See admin instructions. Take 1 mg at lunch time and 2 mg in the evening at 6:00 PM.   temazepam 7.5 MG  capsule Commonly known as: RESTORIL Take 7.5 mg by mouth at bedtime.   VISION FORMULA EYE HEALTH PO Take 1 tablet by mouth in the morning and at bedtime.   vitamin B-12 250 MCG tablet Commonly known as: CYANOCOBALAMIN  Take 250 mcg by mouth daily.   Vitamin D-3 25 MCG (1000 UT) Caps Take 1,000 Units by mouth daily.         Time coordinating discharge: 39 minutes  Signed:  Malai Lady  [facility] 03/10/2019, 1:14 PM

## 2019-03-10 NOTE — ED Notes (Signed)
Called to update son that pt is moving to [address], he will call for an update in the morning.

## 2019-03-10 NOTE — Progress Notes (Signed)
Initial Nutrition Assessment  DOCUMENTATION CODES:   Not applicable  INTERVENTION:  Continue Ensure BID  Magic cup TID with meals, each supplement provides 290 kcal and 9 grams of protein   NUTRITION DIAGNOSIS:   Increased nutrient needs related to acute illness(acute dyspnea secondary to COVID-19 infection) as evidenced by estimated needs.   GOAL:   Patient will meet greater than or equal to 90% of their needs    MONITOR:   PO intake, Supplement acceptance, Labs, Weight trends, I & O's, Skin  REASON FOR ASSESSMENT:   Malnutrition Screening Tool    ASSESSMENT:  RD working remotely.  86 year old female with medical history significant for advanced dementia, CAD, HLD, HTN, intrinsic asthma presented with increasing cough and tachypnea over the last 4 days. Patient seen in PCP office, found to be Covid positive and advised to come to the ED for further evaluation. In ED, work-up showed elevated D-dimer, CT angiogram was negative for PE or pneumonia, new bilateral lower extremity edema, noted mildly tachypneic, and persistent cough.  Patient admitted for observation of acute dyspnea.  Per notes, pt has advanced Alzheimer's and resides at home with her son and his wife. She is minimally verbal at baseline, ambulates with a walker although unsteady and requires assistance per son.  No documented meals or supplement intake for review. Patient is provided Ensure twice daily per medication review. RD will continue to monitor for po intake and will add Magic Cup to meal trays.   Non-pitting BLE edema per RN assessment. Bilateral lower extremity venous duplex exam completed, results pending.   Current wt 119.24 lbs Weight history reviewed, stable over the past 2 years. Medications reviewed and include: Vit D, Protonix, B12  Labs: BG 103-115, Albumin 3.1 (L), Corrected Ca 9.02 (WNL), WBC 2.0 (L)  NUTRITION - FOCUSED PHYSICAL EXAM: Unable to complete at this time   Diet  Order:   Diet Order            Diet Heart Room service appropriate? No; Fluid consistency: Thin  Diet effective now              EDUCATION NEEDS:   No education needs have been identified at this time  Skin:  Skin Assessment: Reviewed RN Assessment  Last BM:  PTA  Height:   Ht Readings from Last 1 Encounters:  03/10/19 5\' 3"  (1.6 m)    Weight:   Wt Readings from Last 1 Encounters:  03/10/19 54.2 kg    BMI:  Body mass index is 21.15 kg/m.  Estimated Nutritional Needs:   Kcal:  1400-1600  Protein:  70-80  Fluid:  >/= 1.3 L/day   03/12/19, RD, LDN Clinical Nutrition Office Telephone [phone] After Hours/Weekend Pager in [facility]

## 2019-03-10 NOTE — H&P (Addendum)
History and Physical    Jo Johnson OZH:[id number] DOB: Oct 05, 1924 DOA: 03/09/2019  PCP: Mardella Layman, MD  Patient coming from: Home.  Chief Complaint: Cough and tachypnea.  History obtained from patient's son since patient has advanced dementia.  HPI: Jo Johnson is a 86 y.o. female with history of advanced dementia, CAD, hyperlipidemia, hypertension has been experiencing increasing cough and tachypnea over the last 4 days.  Cough is nonproductive did not complain of any chest pain.  Had gone to her PCPs office today with patient's son and was diagnosed with Covid positive.  Given the patient had persistent cough with tachypnea was advised to come to the ER.  ED Course: In the ER patient was not hypoxic but was mildly tachypneic with persistent cough.  D-dimer was elevated CT angiogram was negative for any PE or pneumonia.  Inflammatory markers are negative except for D-dimer.  Covid test was positive.  On exam patient has bilateral lower extremity edema which is new as per the patient's son.  Labs show pancytopenia with WBC count of 2.2 hemoglobin 11.3 platelets 93.  This appears to be new.  BNP of 62.5.  Given the persistent cough with tachypnea and newly diagnosed Covid infection with lower extremity edema to rule out DVT admitted for further observation.  Review of Systems: As per HPI, rest all negative.   Past Medical History:  Diagnosis Date  . Acid reflux   . Asthma   . Chronic cough   . Coronary artery disease    2 vessel CAD with normal LV function status post left circumflex stenting with a drug-eluting stent by Dr. Bryan Lemma  . Hyperlipidemia   . Hypertension   . Myocardial infarction [facility])     Past Surgical History:  Procedure Laterality Date  . APPENDECTOMY    . CHOLECYSTECTOMY    . EYE SURGERY    . LEFT HEART CATHETERIZATION WITH CORONARY ANGIOGRAM N/A 06/22/2012   Procedure: LEFT HEART CATHETERIZATION WITH CORONARY ANGIOGRAM;  Surgeon: Marykay Lex, MD;  Location: [facility] CATH LAB;  Service: Cardiovascular;  Laterality: N/A;  . PERCUTANEOUS CORONARY STENT INTERVENTION (PCI-S)  06/22/2012   Procedure: PERCUTANEOUS CORONARY STENT INTERVENTION (PCI-S);  Surgeon: Marykay Lex, MD;  Location: [facility] CATH LAB;  Service: Cardiovascular;;  . surgery to legs d/t trauma       reports that she has never smoked. Her smokeless tobacco use includes snuff. She reports that she does not drink alcohol or use drugs.  No Known Allergies  Family History  Problem Relation Age of Onset  . CVA Mother   . Cancer Father   . Dementia Brother   . COPD Brother     Prior to Admission medications   Medication Sig Start Date End Date Taking? Authorizing Provider  acetaminophen (TYLENOL) 500 MG tablet Take 500-1,000 mg by mouth every 6 (six) hours as needed for mild pain.   Yes [provider]  aspirin EC 81 MG EC tablet Take 1 tablet (81 mg total) by mouth daily. 06/23/12  Yes Simmons, Brittainy M, PA-C  atorvastatin (LIPITOR) 10 MG tablet TAKE 1 TABLET BY MOUTH DAILY AT 6 PM Patient taking differently: Take 10 mg by mouth daily.  12/10/12  Yes Runell Gess, MD  cephALEXin (KEFLEX) 250 MG capsule Take 250 mg by mouth daily.   Yes [provider]  Cholecalciferol (VITAMIN D-3) 1000 units CAPS Take 1,000 Units by mouth daily.    Yes [provider]  guaiFENesin (  ROBITUSSIN) 100 MG/5ML SOLN Take 5 mLs by mouth every 4 (four) hours as needed for cough or to loosen phlegm.   Yes [provider]  ipratropium (ATROVENT) 0.03 % nasal spray SMARTSIG:1-2 Spray(s) Both Nares 1 to 3 Times Daily PRN 03/07/19  Yes [provider]  isosorbide mononitrate (IMDUR) 30 MG 24 hr tablet TAKE 1/2 TABLET BY MOUTH DAILY. KEEP UPCOMING APPOINTMENT FOR FUTURE REFILLS. Patient taking differently: Take 15 mg by mouth daily.  01/26/19  Yes Runell Gess, MD  metoprolol tartrate (LOPRESSOR) 25 MG tablet Take 0.5 tablets (12.5 mg total) by  mouth 2 (two) times daily. 03/04/19  Yes Runell Gess, MD  Multiple Vitamins-Minerals (VISION FORMULA EYE HEALTH PO) Take 1 tablet by mouth in the morning and at bedtime.    Yes [provider]  nitroGLYCERIN (NITROSTAT) 0.4 MG SL tablet Place 1 tablet (0.4 mg total) under the tongue every 5 (five) minutes x 3 doses as needed for chest pain. 03/16/14  Yes Runell Gess, MD  pantoprazole (PROTONIX) 40 MG tablet TAKE 1 TABLET BY MOUTH EVERY DAY Patient taking differently: Take 40 mg by mouth daily.  03/10/17  Yes Runell Gess, MD  risperiDONE (RISPERDAL M-TABS) 1 MG disintegrating tablet Place 1 mg under the tongue See admin instructions. Take 1 mg at lunch time and 2 mg in the evening at 6:00 PM. 10/01/18  Yes [provider]  temazepam (RESTORIL) 7.5 MG capsule Take 7.5 mg by mouth at bedtime. 02/16/19  Yes [provider]  vitamin B-12 (CYANOCOBALAMIN) 250 MCG tablet Take 250 mcg by mouth daily.   Yes [provider]    Physical Exam: Constitutional: Moderately built and nourished. Vitals:   03/09/19 2130 03/09/19 2200 03/09/19 2230 03/09/19 2348  BP: (!) 162/77 (!) 176/86 (!) 161/80 (!) 151/86  Pulse: 84 81 80 71  Resp:  18 (!) 24 (!) 30  Temp:      TempSrc:      SpO2: 95% 95% 94% 94%   Eyes: Anicteric no pallor. ENMT: No discharge from the ears eyes nose or mouth. Neck: No mass or.  No neck rigidity. Respiratory: No rhonchi or crepitations. Cardiovascular: S1-S2 heard. Abdomen: Soft nontender bowel sound present. Musculoskeletal: Bilateral lower extremity edema present. Skin: No rash. Neurologic: Patient is sleeping at this time but as per the nurse patient was following commands.  Moving all extremities. Psychiatric: Presently sleeping.   Labs on Admission: I have personally reviewed following labs and imaging studies  CBC: Recent Labs  Lab 03/09/19 1802  WBC 2.2*  NEUTROABS 1.3*  HGB 11.3*  HCT 36.1  MCV 96.8  PLT 93*    Basic Metabolic Panel: Recent Labs  Lab 03/09/19 1802  NA 140  K 4.0  CL 103  CO2 30  GLUCOSE 115*  BUN 20  CREATININE 0.75  CALCIUM 8.3*   GFR: CrCl cannot be calculated (Unknown ideal weight.). Liver Function Tests: Recent Labs  Lab 03/09/19 1802  AST 22  ALT 14  ALKPHOS 55  BILITOT 0.1*  PROT 5.6*  ALBUMIN 3.1*   No results for input(s): LIPASE, AMYLASE in the last 168 hours. No results for input(s): AMMONIA in the last 168 hours. Coagulation Profile: No results for input(s): INR, PROTIME in the last 168 hours. Cardiac Enzymes: No results for input(s): CKTOTAL, CKMB, CKMBINDEX, TROPONINI in the last 168 hours. BNP (last 3 results) No results for input(s): PROBNP in the last 8760 hours. HbA1C: No results for input(s): HGBA1C in  the last 72 hours. CBG: No results for input(s): GLUCAP in the last 168 hours. Lipid Profile: Recent Labs    03/09/19 1802  TRIG 91   Thyroid Function Tests: No results for input(s): TSH, T4TOTAL, FREET4, T3FREE, THYROIDAB in the last 72 hours. Anemia Panel: Recent Labs    03/09/19 1800  FERRITIN 114   Urine analysis:    Component Value Date/Time   COLORURINE YELLOW 11/07/2018 1933   APPEARANCEUR HAZY (A) 11/07/2018 1933   LABSPEC 1.008 11/07/2018 1933   PHURINE 6.0 11/07/2018 1933   GLUCOSEU NEGATIVE 11/07/2018 [place] NEGATIVE 11/07/2018 [place] NEGATIVE 11/07/2018 [place] 11/07/2018 1933   PROTEINUR NEGATIVE 11/07/2018 1933   NITRITE POSITIVE (A) 11/07/2018 1933   LEUKOCYTESUR MODERATE (A) 11/07/2018 1933   Sepsis Labs: @LABRCNTIP (procalcitonin:4,lacticidven:4) )No results found for this or any previous visit (from the past 240 hour(s)).   Radiological Exams on Admission: CT Angio Chest PE W/Cm &/Or Wo Cm  Result Date: 03/09/2019 CLINICAL DATA:  Shortness of breath, lower extremity edema, COVID-19 EXAM: CT ANGIOGRAPHY CHEST WITH CONTRAST TECHNIQUE: Multidetector CT imaging of  the chest was performed using the standard protocol during bolus administration of intravenous contrast. Multiplanar CT image reconstructions and MIPs were obtained to evaluate the vascular anatomy. CONTRAST:  34mL OMNIPAQUE IOHEXOL 350 MG/ML SOLN COMPARISON:  None. FINDINGS: Cardiovascular: Contrast injection is sufficient to demonstrate satisfactory opacification of the pulmonary arteries to the segmental level. There is no pulmonary embolus or evidence of right heart strain. The size of the main pulmonary artery is normal. Heart size is normal, with no pericardial effusion. The course and caliber of the aorta are normal. There is mild atherosclerotic calcification. Opacification decreased due to pulmonary arterial phase contrast bolus timing. Mediastinum/Nodes: No mediastinal, hilar or axillary lymphadenopathy. The visualized thyroid and thoracic esophageal course are unremarkable. Lungs/Pleura: Airways are patent. No focal consolidation, pulmonary infarct or pleural effusion. Right basilar atelectasis. Upper Abdomen: Contrast bolus timing is not optimized for evaluation of the abdominal organs. The visualized portions of the organs of the upper abdomen are normal. Musculoskeletal: No chest wall abnormality. No bony spinal canal stenosis. Review of the MIP images confirms the above findings. IMPRESSION: 1. No pulmonary embolus. 2. No acute thoracic abnormality. Aortic Atherosclerosis (ICD10-I70.0). Electronically Signed   By: Ulyses Jarred M.D.   On: 03/09/2019 21:32   DG Chest Port 1 View  Result Date: 03/09/2019 CLINICAL DATA:  Short of breath and cough EXAM: PORTABLE CHEST 1 VIEW COMPARISON:  11/07/2018 FINDINGS: Elevated right hemidiaphragm unchanged with right lower lobe atelectasis. COPD with pulmonary hyperinflation. No acute infiltrate or effusion. Negative for heart failure. Atherosclerotic aortic arch. IMPRESSION: No acute abnormality. Chronic elevation right hemidiaphragm with right lower lobe  atelectasis. Electronically Signed   By: Franchot Gallo M.D.   On: 03/09/2019 17:44    EKG: Independently reviewed.  Poor tracing will need repeat EKG.  Assessment/Plan Principal Problem:   Acute dyspnea Active Problems:   Essential hypertension   Intrinsic asthma   Cough   NSTEMI - S/P PCI + DES to Prox Circumflex   CAD (coronary artery disease)   Dyspnea    1. Acute dyspnea with persistent nonproductive cough likely from Covid infection presently not hypoxic afebrile and inflammatory markers are negative.  I have not started patient on remdesivir or Decadron at this time.  Will closely observe.  We will repeat inflammatory markers.  CT angio was negative for PE. 2. Bilateral lower extremity edema  with BNP being normal and CT angios negative.  Will check Dopplers to rule out DVT since D-dimer is elevated. 3. Pancytopenia appears to be new.  Follow CBC closely. 4. CAD has not complained of any chest pain.  Will repeat EKG since first one was not clear.  On aspirin statins and beta-blockers. 5. Hypertension on beta-blockers and Imdur. 6. Dementia presently on Risperdal behavioral changes.   DVT prophylaxis: Lovenox.  Note that patient has low platelet counts but since we are concerned that patient may have DVT presently on Lovenox.  Dopplers are negative for DVTs and follow CBC closely to for any further deterioration in platelet counts. Code Status: DNR as confirmed with patient's son. Family Communication: Patient's son. Disposition Plan: Home. Consults called: None. Admission status: Observation.   Eduard Clos MD [facility] Pager [phone].  If 7PM-7AM, please contact night-coverage [email or web address] Password [facility]  03/10/2019, 12:14 AM

## 2019-03-10 NOTE — Plan of Care (Signed)
Discharge instructions reviewed with son, questions answered, verbalized understanding.  Patient transported to main entrance via wheelchair and assisted into son's vehicle for transport home.

## 2019-03-10 NOTE — Progress Notes (Signed)
Bilateral lower extremity venous duplex exam completed.  Preliminary results can be found under CV proc under chart review.  03/10/2019 9:16 AM  Adri Schloss, K., RDMS, RVT

## 2019-03-14 LAB — CULTURE, BLOOD (ROUTINE X 2)
Culture: NO GROWTH
Culture: NO GROWTH

## 2019-03-15 DIAGNOSIS — D61818 Other pancytopenia: Secondary | ICD-10-CM | POA: Diagnosis not present

## 2019-03-15 DIAGNOSIS — R600 Localized edema: Secondary | ICD-10-CM | POA: Diagnosis not present

## 2019-03-15 DIAGNOSIS — I10 Essential (primary) hypertension: Secondary | ICD-10-CM | POA: Diagnosis not present

## 2019-03-15 DIAGNOSIS — G309 Alzheimer's disease, unspecified: Secondary | ICD-10-CM | POA: Diagnosis not present

## 2019-03-15 DIAGNOSIS — Z8616 Personal history of COVID-19: Secondary | ICD-10-CM | POA: Diagnosis not present

## 2019-03-15 DIAGNOSIS — Z7189 Other specified counseling: Secondary | ICD-10-CM | POA: Diagnosis not present

## 2019-03-15 DIAGNOSIS — I2510 Atherosclerotic heart disease of native coronary artery without angina pectoris: Secondary | ICD-10-CM | POA: Diagnosis not present

## 2019-03-16 DIAGNOSIS — J309 Allergic rhinitis, unspecified: Secondary | ICD-10-CM | POA: Diagnosis not present

## 2019-03-16 DIAGNOSIS — H3530 Unspecified macular degeneration: Secondary | ICD-10-CM | POA: Diagnosis not present

## 2019-03-16 DIAGNOSIS — F0151 Vascular dementia with behavioral disturbance: Secondary | ICD-10-CM | POA: Diagnosis not present

## 2019-03-16 DIAGNOSIS — I252 Old myocardial infarction: Secondary | ICD-10-CM | POA: Diagnosis not present

## 2019-03-16 DIAGNOSIS — I25119 Atherosclerotic heart disease of native coronary artery with unspecified angina pectoris: Secondary | ICD-10-CM | POA: Diagnosis not present

## 2019-03-16 DIAGNOSIS — R1310 Dysphagia, unspecified: Secondary | ICD-10-CM | POA: Diagnosis not present

## 2019-03-16 DIAGNOSIS — F17220 Nicotine dependence, chewing tobacco, uncomplicated: Secondary | ICD-10-CM | POA: Diagnosis not present

## 2019-03-16 DIAGNOSIS — I679 Cerebrovascular disease, unspecified: Secondary | ICD-10-CM | POA: Diagnosis not present

## 2019-03-16 DIAGNOSIS — J45909 Unspecified asthma, uncomplicated: Secondary | ICD-10-CM | POA: Diagnosis not present

## 2019-03-16 DIAGNOSIS — U071 COVID-19: Secondary | ICD-10-CM | POA: Diagnosis not present

## 2019-03-16 DIAGNOSIS — E785 Hyperlipidemia, unspecified: Secondary | ICD-10-CM | POA: Diagnosis not present

## 2019-03-16 DIAGNOSIS — I10 Essential (primary) hypertension: Secondary | ICD-10-CM | POA: Diagnosis not present

## 2019-03-16 DIAGNOSIS — K219 Gastro-esophageal reflux disease without esophagitis: Secondary | ICD-10-CM | POA: Diagnosis not present

## 2019-03-16 DIAGNOSIS — Z87440 Personal history of urinary (tract) infections: Secondary | ICD-10-CM | POA: Diagnosis not present

## 2019-03-17 DIAGNOSIS — R1310 Dysphagia, unspecified: Secondary | ICD-10-CM | POA: Diagnosis not present

## 2019-03-17 DIAGNOSIS — U071 COVID-19: Secondary | ICD-10-CM | POA: Diagnosis not present

## 2019-03-17 DIAGNOSIS — I25119 Atherosclerotic heart disease of native coronary artery with unspecified angina pectoris: Secondary | ICD-10-CM | POA: Diagnosis not present

## 2019-03-17 DIAGNOSIS — I10 Essential (primary) hypertension: Secondary | ICD-10-CM | POA: Diagnosis not present

## 2019-03-17 DIAGNOSIS — I679 Cerebrovascular disease, unspecified: Secondary | ICD-10-CM | POA: Diagnosis not present

## 2019-03-17 DIAGNOSIS — F0151 Vascular dementia with behavioral disturbance: Secondary | ICD-10-CM | POA: Diagnosis not present

## 2019-03-18 DIAGNOSIS — G309 Alzheimer's disease, unspecified: Secondary | ICD-10-CM | POA: Diagnosis not present

## 2019-03-18 DIAGNOSIS — R269 Unspecified abnormalities of gait and mobility: Secondary | ICD-10-CM | POA: Diagnosis not present

## 2019-03-18 DIAGNOSIS — I679 Cerebrovascular disease, unspecified: Secondary | ICD-10-CM | POA: Diagnosis not present

## 2019-03-18 DIAGNOSIS — I10 Essential (primary) hypertension: Secondary | ICD-10-CM | POA: Diagnosis not present

## 2019-03-18 DIAGNOSIS — G4700 Insomnia, unspecified: Secondary | ICD-10-CM | POA: Diagnosis not present

## 2019-03-18 DIAGNOSIS — U071 COVID-19: Secondary | ICD-10-CM | POA: Diagnosis not present

## 2019-03-18 DIAGNOSIS — F0151 Vascular dementia with behavioral disturbance: Secondary | ICD-10-CM | POA: Diagnosis not present

## 2019-03-18 DIAGNOSIS — I25119 Atherosclerotic heart disease of native coronary artery with unspecified angina pectoris: Secondary | ICD-10-CM | POA: Diagnosis not present

## 2019-03-18 DIAGNOSIS — R1310 Dysphagia, unspecified: Secondary | ICD-10-CM | POA: Diagnosis not present

## 2019-03-18 DIAGNOSIS — E46 Unspecified protein-calorie malnutrition: Secondary | ICD-10-CM | POA: Diagnosis not present

## 2019-03-18 DIAGNOSIS — R443 Hallucinations, unspecified: Secondary | ICD-10-CM | POA: Diagnosis not present

## 2019-03-21 DIAGNOSIS — I679 Cerebrovascular disease, unspecified: Secondary | ICD-10-CM | POA: Diagnosis not present

## 2019-03-21 DIAGNOSIS — U071 COVID-19: Secondary | ICD-10-CM | POA: Diagnosis not present

## 2019-03-21 DIAGNOSIS — I10 Essential (primary) hypertension: Secondary | ICD-10-CM | POA: Diagnosis not present

## 2019-03-21 DIAGNOSIS — R1310 Dysphagia, unspecified: Secondary | ICD-10-CM | POA: Diagnosis not present

## 2019-03-21 DIAGNOSIS — F0151 Vascular dementia with behavioral disturbance: Secondary | ICD-10-CM | POA: Diagnosis not present

## 2019-03-21 DIAGNOSIS — I25119 Atherosclerotic heart disease of native coronary artery with unspecified angina pectoris: Secondary | ICD-10-CM | POA: Diagnosis not present

## 2019-03-22 DIAGNOSIS — F0151 Vascular dementia with behavioral disturbance: Secondary | ICD-10-CM | POA: Diagnosis not present

## 2019-03-22 DIAGNOSIS — Z87440 Personal history of urinary (tract) infections: Secondary | ICD-10-CM | POA: Diagnosis not present

## 2019-03-22 DIAGNOSIS — I25119 Atherosclerotic heart disease of native coronary artery with unspecified angina pectoris: Secondary | ICD-10-CM | POA: Diagnosis not present

## 2019-03-22 DIAGNOSIS — J309 Allergic rhinitis, unspecified: Secondary | ICD-10-CM | POA: Diagnosis not present

## 2019-03-22 DIAGNOSIS — H3530 Unspecified macular degeneration: Secondary | ICD-10-CM | POA: Diagnosis not present

## 2019-03-22 DIAGNOSIS — J45909 Unspecified asthma, uncomplicated: Secondary | ICD-10-CM | POA: Diagnosis not present

## 2019-03-22 DIAGNOSIS — U071 COVID-19: Secondary | ICD-10-CM | POA: Diagnosis not present

## 2019-03-22 DIAGNOSIS — F17220 Nicotine dependence, chewing tobacco, uncomplicated: Secondary | ICD-10-CM | POA: Diagnosis not present

## 2019-03-22 DIAGNOSIS — K219 Gastro-esophageal reflux disease without esophagitis: Secondary | ICD-10-CM | POA: Diagnosis not present

## 2019-03-22 DIAGNOSIS — I10 Essential (primary) hypertension: Secondary | ICD-10-CM | POA: Diagnosis not present

## 2019-03-22 DIAGNOSIS — E785 Hyperlipidemia, unspecified: Secondary | ICD-10-CM | POA: Diagnosis not present

## 2019-03-22 DIAGNOSIS — I252 Old myocardial infarction: Secondary | ICD-10-CM | POA: Diagnosis not present

## 2019-03-22 DIAGNOSIS — I679 Cerebrovascular disease, unspecified: Secondary | ICD-10-CM | POA: Diagnosis not present

## 2019-03-22 DIAGNOSIS — R1310 Dysphagia, unspecified: Secondary | ICD-10-CM | POA: Diagnosis not present

## 2019-03-23 DIAGNOSIS — U071 COVID-19: Secondary | ICD-10-CM | POA: Diagnosis not present

## 2019-03-23 DIAGNOSIS — I679 Cerebrovascular disease, unspecified: Secondary | ICD-10-CM | POA: Diagnosis not present

## 2019-03-23 DIAGNOSIS — I25119 Atherosclerotic heart disease of native coronary artery with unspecified angina pectoris: Secondary | ICD-10-CM | POA: Diagnosis not present

## 2019-03-23 DIAGNOSIS — R1310 Dysphagia, unspecified: Secondary | ICD-10-CM | POA: Diagnosis not present

## 2019-03-23 DIAGNOSIS — I10 Essential (primary) hypertension: Secondary | ICD-10-CM | POA: Diagnosis not present

## 2019-03-23 DIAGNOSIS — F0151 Vascular dementia with behavioral disturbance: Secondary | ICD-10-CM | POA: Diagnosis not present

## 2019-03-24 DIAGNOSIS — I10 Essential (primary) hypertension: Secondary | ICD-10-CM | POA: Diagnosis not present

## 2019-03-24 DIAGNOSIS — I25119 Atherosclerotic heart disease of native coronary artery with unspecified angina pectoris: Secondary | ICD-10-CM | POA: Diagnosis not present

## 2019-03-24 DIAGNOSIS — U071 COVID-19: Secondary | ICD-10-CM | POA: Diagnosis not present

## 2019-03-24 DIAGNOSIS — I679 Cerebrovascular disease, unspecified: Secondary | ICD-10-CM | POA: Diagnosis not present

## 2019-03-24 DIAGNOSIS — F0151 Vascular dementia with behavioral disturbance: Secondary | ICD-10-CM | POA: Diagnosis not present

## 2019-03-24 DIAGNOSIS — R1310 Dysphagia, unspecified: Secondary | ICD-10-CM | POA: Diagnosis not present

## 2019-03-25 DIAGNOSIS — U071 COVID-19: Secondary | ICD-10-CM | POA: Diagnosis not present

## 2019-03-25 DIAGNOSIS — F0151 Vascular dementia with behavioral disturbance: Secondary | ICD-10-CM | POA: Diagnosis not present

## 2019-03-25 DIAGNOSIS — R1310 Dysphagia, unspecified: Secondary | ICD-10-CM | POA: Diagnosis not present

## 2019-03-25 DIAGNOSIS — I25119 Atherosclerotic heart disease of native coronary artery with unspecified angina pectoris: Secondary | ICD-10-CM | POA: Diagnosis not present

## 2019-03-25 DIAGNOSIS — I679 Cerebrovascular disease, unspecified: Secondary | ICD-10-CM | POA: Diagnosis not present

## 2019-03-25 DIAGNOSIS — I10 Essential (primary) hypertension: Secondary | ICD-10-CM | POA: Diagnosis not present

## 2019-03-28 DIAGNOSIS — I25119 Atherosclerotic heart disease of native coronary artery with unspecified angina pectoris: Secondary | ICD-10-CM | POA: Diagnosis not present

## 2019-03-28 DIAGNOSIS — I10 Essential (primary) hypertension: Secondary | ICD-10-CM | POA: Diagnosis not present

## 2019-03-28 DIAGNOSIS — F0151 Vascular dementia with behavioral disturbance: Secondary | ICD-10-CM | POA: Diagnosis not present

## 2019-03-28 DIAGNOSIS — R1310 Dysphagia, unspecified: Secondary | ICD-10-CM | POA: Diagnosis not present

## 2019-03-28 DIAGNOSIS — I679 Cerebrovascular disease, unspecified: Secondary | ICD-10-CM | POA: Diagnosis not present

## 2019-03-28 DIAGNOSIS — U071 COVID-19: Secondary | ICD-10-CM | POA: Diagnosis not present

## 2019-03-30 DIAGNOSIS — I10 Essential (primary) hypertension: Secondary | ICD-10-CM | POA: Diagnosis not present

## 2019-03-30 DIAGNOSIS — I25119 Atherosclerotic heart disease of native coronary artery with unspecified angina pectoris: Secondary | ICD-10-CM | POA: Diagnosis not present

## 2019-03-30 DIAGNOSIS — F0151 Vascular dementia with behavioral disturbance: Secondary | ICD-10-CM | POA: Diagnosis not present

## 2019-03-30 DIAGNOSIS — I679 Cerebrovascular disease, unspecified: Secondary | ICD-10-CM | POA: Diagnosis not present

## 2019-03-30 DIAGNOSIS — R1310 Dysphagia, unspecified: Secondary | ICD-10-CM | POA: Diagnosis not present

## 2019-03-30 DIAGNOSIS — U071 COVID-19: Secondary | ICD-10-CM | POA: Diagnosis not present

## 2019-03-31 DIAGNOSIS — I25119 Atherosclerotic heart disease of native coronary artery with unspecified angina pectoris: Secondary | ICD-10-CM | POA: Diagnosis not present

## 2019-03-31 DIAGNOSIS — F0151 Vascular dementia with behavioral disturbance: Secondary | ICD-10-CM | POA: Diagnosis not present

## 2019-03-31 DIAGNOSIS — U071 COVID-19: Secondary | ICD-10-CM | POA: Diagnosis not present

## 2019-03-31 DIAGNOSIS — R1310 Dysphagia, unspecified: Secondary | ICD-10-CM | POA: Diagnosis not present

## 2019-03-31 DIAGNOSIS — I10 Essential (primary) hypertension: Secondary | ICD-10-CM | POA: Diagnosis not present

## 2019-03-31 DIAGNOSIS — I679 Cerebrovascular disease, unspecified: Secondary | ICD-10-CM | POA: Diagnosis not present

## 2019-04-01 DIAGNOSIS — R1310 Dysphagia, unspecified: Secondary | ICD-10-CM | POA: Diagnosis not present

## 2019-04-01 DIAGNOSIS — I679 Cerebrovascular disease, unspecified: Secondary | ICD-10-CM | POA: Diagnosis not present

## 2019-04-01 DIAGNOSIS — U071 COVID-19: Secondary | ICD-10-CM | POA: Diagnosis not present

## 2019-04-01 DIAGNOSIS — F0151 Vascular dementia with behavioral disturbance: Secondary | ICD-10-CM | POA: Diagnosis not present

## 2019-04-01 DIAGNOSIS — I10 Essential (primary) hypertension: Secondary | ICD-10-CM | POA: Diagnosis not present

## 2019-04-01 DIAGNOSIS — I25119 Atherosclerotic heart disease of native coronary artery with unspecified angina pectoris: Secondary | ICD-10-CM | POA: Diagnosis not present

## 2019-04-05 DIAGNOSIS — I25119 Atherosclerotic heart disease of native coronary artery with unspecified angina pectoris: Secondary | ICD-10-CM | POA: Diagnosis not present

## 2019-04-05 DIAGNOSIS — F0151 Vascular dementia with behavioral disturbance: Secondary | ICD-10-CM | POA: Diagnosis not present

## 2019-04-05 DIAGNOSIS — R1310 Dysphagia, unspecified: Secondary | ICD-10-CM | POA: Diagnosis not present

## 2019-04-05 DIAGNOSIS — I10 Essential (primary) hypertension: Secondary | ICD-10-CM | POA: Diagnosis not present

## 2019-04-05 DIAGNOSIS — I679 Cerebrovascular disease, unspecified: Secondary | ICD-10-CM | POA: Diagnosis not present

## 2019-04-05 DIAGNOSIS — U071 COVID-19: Secondary | ICD-10-CM | POA: Diagnosis not present

## 2019-04-07 DIAGNOSIS — I679 Cerebrovascular disease, unspecified: Secondary | ICD-10-CM | POA: Diagnosis not present

## 2019-04-07 DIAGNOSIS — I25119 Atherosclerotic heart disease of native coronary artery with unspecified angina pectoris: Secondary | ICD-10-CM | POA: Diagnosis not present

## 2019-04-07 DIAGNOSIS — I10 Essential (primary) hypertension: Secondary | ICD-10-CM | POA: Diagnosis not present

## 2019-04-07 DIAGNOSIS — F0151 Vascular dementia with behavioral disturbance: Secondary | ICD-10-CM | POA: Diagnosis not present

## 2019-04-07 DIAGNOSIS — R1310 Dysphagia, unspecified: Secondary | ICD-10-CM | POA: Diagnosis not present

## 2019-04-07 DIAGNOSIS — U071 COVID-19: Secondary | ICD-10-CM | POA: Diagnosis not present

## 2019-04-08 DIAGNOSIS — I10 Essential (primary) hypertension: Secondary | ICD-10-CM | POA: Diagnosis not present

## 2019-04-08 DIAGNOSIS — U071 COVID-19: Secondary | ICD-10-CM | POA: Diagnosis not present

## 2019-04-08 DIAGNOSIS — F0151 Vascular dementia with behavioral disturbance: Secondary | ICD-10-CM | POA: Diagnosis not present

## 2019-04-08 DIAGNOSIS — R1310 Dysphagia, unspecified: Secondary | ICD-10-CM | POA: Diagnosis not present

## 2019-04-08 DIAGNOSIS — I679 Cerebrovascular disease, unspecified: Secondary | ICD-10-CM | POA: Diagnosis not present

## 2019-04-08 DIAGNOSIS — I25119 Atherosclerotic heart disease of native coronary artery with unspecified angina pectoris: Secondary | ICD-10-CM | POA: Diagnosis not present

## 2019-04-09 DIAGNOSIS — U071 COVID-19: Secondary | ICD-10-CM | POA: Diagnosis not present

## 2019-04-09 DIAGNOSIS — I679 Cerebrovascular disease, unspecified: Secondary | ICD-10-CM | POA: Diagnosis not present

## 2019-04-09 DIAGNOSIS — I10 Essential (primary) hypertension: Secondary | ICD-10-CM | POA: Diagnosis not present

## 2019-04-09 DIAGNOSIS — F0151 Vascular dementia with behavioral disturbance: Secondary | ICD-10-CM | POA: Diagnosis not present

## 2019-04-09 DIAGNOSIS — R1310 Dysphagia, unspecified: Secondary | ICD-10-CM | POA: Diagnosis not present

## 2019-04-09 DIAGNOSIS — I25119 Atherosclerotic heart disease of native coronary artery with unspecified angina pectoris: Secondary | ICD-10-CM | POA: Diagnosis not present

## 2019-04-12 DIAGNOSIS — M545 Low back pain: Secondary | ICD-10-CM | POA: Diagnosis not present

## 2019-04-12 DIAGNOSIS — I679 Cerebrovascular disease, unspecified: Secondary | ICD-10-CM | POA: Diagnosis not present

## 2019-04-12 DIAGNOSIS — I2510 Atherosclerotic heart disease of native coronary artery without angina pectoris: Secondary | ICD-10-CM | POA: Diagnosis not present

## 2019-04-12 DIAGNOSIS — F0151 Vascular dementia with behavioral disturbance: Secondary | ICD-10-CM | POA: Diagnosis not present

## 2019-04-12 DIAGNOSIS — R1310 Dysphagia, unspecified: Secondary | ICD-10-CM | POA: Diagnosis not present

## 2019-04-12 DIAGNOSIS — R269 Unspecified abnormalities of gait and mobility: Secondary | ICD-10-CM | POA: Diagnosis not present

## 2019-04-12 DIAGNOSIS — G309 Alzheimer's disease, unspecified: Secondary | ICD-10-CM | POA: Diagnosis not present

## 2019-04-12 DIAGNOSIS — I25119 Atherosclerotic heart disease of native coronary artery with unspecified angina pectoris: Secondary | ICD-10-CM | POA: Diagnosis not present

## 2019-04-12 DIAGNOSIS — E46 Unspecified protein-calorie malnutrition: Secondary | ICD-10-CM | POA: Diagnosis not present

## 2019-04-12 DIAGNOSIS — H9193 Unspecified hearing loss, bilateral: Secondary | ICD-10-CM | POA: Diagnosis not present

## 2019-04-12 DIAGNOSIS — I10 Essential (primary) hypertension: Secondary | ICD-10-CM | POA: Diagnosis not present

## 2019-04-12 DIAGNOSIS — R443 Hallucinations, unspecified: Secondary | ICD-10-CM | POA: Diagnosis not present

## 2019-04-12 DIAGNOSIS — U071 COVID-19: Secondary | ICD-10-CM | POA: Diagnosis not present

## 2019-04-14 DIAGNOSIS — F0151 Vascular dementia with behavioral disturbance: Secondary | ICD-10-CM | POA: Diagnosis not present

## 2019-04-14 DIAGNOSIS — U071 COVID-19: Secondary | ICD-10-CM | POA: Diagnosis not present

## 2019-04-14 DIAGNOSIS — I25119 Atherosclerotic heart disease of native coronary artery with unspecified angina pectoris: Secondary | ICD-10-CM | POA: Diagnosis not present

## 2019-04-14 DIAGNOSIS — R1310 Dysphagia, unspecified: Secondary | ICD-10-CM | POA: Diagnosis not present

## 2019-04-14 DIAGNOSIS — I679 Cerebrovascular disease, unspecified: Secondary | ICD-10-CM | POA: Diagnosis not present

## 2019-04-14 DIAGNOSIS — I10 Essential (primary) hypertension: Secondary | ICD-10-CM | POA: Diagnosis not present

## 2019-04-15 DIAGNOSIS — I25119 Atherosclerotic heart disease of native coronary artery with unspecified angina pectoris: Secondary | ICD-10-CM | POA: Diagnosis not present

## 2019-04-15 DIAGNOSIS — U071 COVID-19: Secondary | ICD-10-CM | POA: Diagnosis not present

## 2019-04-15 DIAGNOSIS — R1310 Dysphagia, unspecified: Secondary | ICD-10-CM | POA: Diagnosis not present

## 2019-04-15 DIAGNOSIS — I10 Essential (primary) hypertension: Secondary | ICD-10-CM | POA: Diagnosis not present

## 2019-04-15 DIAGNOSIS — F0151 Vascular dementia with behavioral disturbance: Secondary | ICD-10-CM | POA: Diagnosis not present

## 2019-04-15 DIAGNOSIS — I679 Cerebrovascular disease, unspecified: Secondary | ICD-10-CM | POA: Diagnosis not present

## 2019-04-16 DIAGNOSIS — F0151 Vascular dementia with behavioral disturbance: Secondary | ICD-10-CM | POA: Diagnosis not present

## 2019-04-16 DIAGNOSIS — I10 Essential (primary) hypertension: Secondary | ICD-10-CM | POA: Diagnosis not present

## 2019-04-16 DIAGNOSIS — R1310 Dysphagia, unspecified: Secondary | ICD-10-CM | POA: Diagnosis not present

## 2019-04-16 DIAGNOSIS — U071 COVID-19: Secondary | ICD-10-CM | POA: Diagnosis not present

## 2019-04-16 DIAGNOSIS — I679 Cerebrovascular disease, unspecified: Secondary | ICD-10-CM | POA: Diagnosis not present

## 2019-04-16 DIAGNOSIS — I25119 Atherosclerotic heart disease of native coronary artery with unspecified angina pectoris: Secondary | ICD-10-CM | POA: Diagnosis not present

## 2019-04-21 DIAGNOSIS — F0151 Vascular dementia with behavioral disturbance: Secondary | ICD-10-CM | POA: Diagnosis not present

## 2019-04-21 DIAGNOSIS — R1310 Dysphagia, unspecified: Secondary | ICD-10-CM | POA: Diagnosis not present

## 2019-04-21 DIAGNOSIS — U071 COVID-19: Secondary | ICD-10-CM | POA: Diagnosis not present

## 2019-04-21 DIAGNOSIS — I10 Essential (primary) hypertension: Secondary | ICD-10-CM | POA: Diagnosis not present

## 2019-04-21 DIAGNOSIS — I679 Cerebrovascular disease, unspecified: Secondary | ICD-10-CM | POA: Diagnosis not present

## 2019-04-21 DIAGNOSIS — I25119 Atherosclerotic heart disease of native coronary artery with unspecified angina pectoris: Secondary | ICD-10-CM | POA: Diagnosis not present

## 2019-04-22 DIAGNOSIS — R1310 Dysphagia, unspecified: Secondary | ICD-10-CM | POA: Diagnosis not present

## 2019-04-22 DIAGNOSIS — F0151 Vascular dementia with behavioral disturbance: Secondary | ICD-10-CM | POA: Diagnosis not present

## 2019-04-22 DIAGNOSIS — F17220 Nicotine dependence, chewing tobacco, uncomplicated: Secondary | ICD-10-CM | POA: Diagnosis not present

## 2019-04-22 DIAGNOSIS — Z87440 Personal history of urinary (tract) infections: Secondary | ICD-10-CM | POA: Diagnosis not present

## 2019-04-22 DIAGNOSIS — K219 Gastro-esophageal reflux disease without esophagitis: Secondary | ICD-10-CM | POA: Diagnosis not present

## 2019-04-22 DIAGNOSIS — E785 Hyperlipidemia, unspecified: Secondary | ICD-10-CM | POA: Diagnosis not present

## 2019-04-22 DIAGNOSIS — J309 Allergic rhinitis, unspecified: Secondary | ICD-10-CM | POA: Diagnosis not present

## 2019-04-22 DIAGNOSIS — I25119 Atherosclerotic heart disease of native coronary artery with unspecified angina pectoris: Secondary | ICD-10-CM | POA: Diagnosis not present

## 2019-04-22 DIAGNOSIS — I679 Cerebrovascular disease, unspecified: Secondary | ICD-10-CM | POA: Diagnosis not present

## 2019-04-22 DIAGNOSIS — I10 Essential (primary) hypertension: Secondary | ICD-10-CM | POA: Diagnosis not present

## 2019-04-22 DIAGNOSIS — I252 Old myocardial infarction: Secondary | ICD-10-CM | POA: Diagnosis not present

## 2019-04-22 DIAGNOSIS — U071 COVID-19: Secondary | ICD-10-CM | POA: Diagnosis not present

## 2019-04-22 DIAGNOSIS — H3530 Unspecified macular degeneration: Secondary | ICD-10-CM | POA: Diagnosis not present

## 2019-04-22 DIAGNOSIS — J45909 Unspecified asthma, uncomplicated: Secondary | ICD-10-CM | POA: Diagnosis not present

## 2019-04-23 DIAGNOSIS — R1310 Dysphagia, unspecified: Secondary | ICD-10-CM | POA: Diagnosis not present

## 2019-04-23 DIAGNOSIS — I10 Essential (primary) hypertension: Secondary | ICD-10-CM | POA: Diagnosis not present

## 2019-04-23 DIAGNOSIS — F0151 Vascular dementia with behavioral disturbance: Secondary | ICD-10-CM | POA: Diagnosis not present

## 2019-04-23 DIAGNOSIS — I25119 Atherosclerotic heart disease of native coronary artery with unspecified angina pectoris: Secondary | ICD-10-CM | POA: Diagnosis not present

## 2019-04-23 DIAGNOSIS — U071 COVID-19: Secondary | ICD-10-CM | POA: Diagnosis not present

## 2019-04-23 DIAGNOSIS — I679 Cerebrovascular disease, unspecified: Secondary | ICD-10-CM | POA: Diagnosis not present

## 2019-04-26 DIAGNOSIS — I679 Cerebrovascular disease, unspecified: Secondary | ICD-10-CM | POA: Diagnosis not present

## 2019-04-26 DIAGNOSIS — F0151 Vascular dementia with behavioral disturbance: Secondary | ICD-10-CM | POA: Diagnosis not present

## 2019-04-26 DIAGNOSIS — I25119 Atherosclerotic heart disease of native coronary artery with unspecified angina pectoris: Secondary | ICD-10-CM | POA: Diagnosis not present

## 2019-04-26 DIAGNOSIS — U071 COVID-19: Secondary | ICD-10-CM | POA: Diagnosis not present

## 2019-04-26 DIAGNOSIS — I10 Essential (primary) hypertension: Secondary | ICD-10-CM | POA: Diagnosis not present

## 2019-04-26 DIAGNOSIS — R1310 Dysphagia, unspecified: Secondary | ICD-10-CM | POA: Diagnosis not present

## 2019-04-28 DIAGNOSIS — U071 COVID-19: Secondary | ICD-10-CM | POA: Diagnosis not present

## 2019-04-28 DIAGNOSIS — I679 Cerebrovascular disease, unspecified: Secondary | ICD-10-CM | POA: Diagnosis not present

## 2019-04-28 DIAGNOSIS — I25119 Atherosclerotic heart disease of native coronary artery with unspecified angina pectoris: Secondary | ICD-10-CM | POA: Diagnosis not present

## 2019-04-28 DIAGNOSIS — I10 Essential (primary) hypertension: Secondary | ICD-10-CM | POA: Diagnosis not present

## 2019-04-28 DIAGNOSIS — R1310 Dysphagia, unspecified: Secondary | ICD-10-CM | POA: Diagnosis not present

## 2019-04-28 DIAGNOSIS — F0151 Vascular dementia with behavioral disturbance: Secondary | ICD-10-CM | POA: Diagnosis not present

## 2019-04-29 DIAGNOSIS — U071 COVID-19: Secondary | ICD-10-CM | POA: Diagnosis not present

## 2019-04-29 DIAGNOSIS — R1310 Dysphagia, unspecified: Secondary | ICD-10-CM | POA: Diagnosis not present

## 2019-04-29 DIAGNOSIS — I25119 Atherosclerotic heart disease of native coronary artery with unspecified angina pectoris: Secondary | ICD-10-CM | POA: Diagnosis not present

## 2019-04-29 DIAGNOSIS — I679 Cerebrovascular disease, unspecified: Secondary | ICD-10-CM | POA: Diagnosis not present

## 2019-04-29 DIAGNOSIS — I10 Essential (primary) hypertension: Secondary | ICD-10-CM | POA: Diagnosis not present

## 2019-04-29 DIAGNOSIS — F0151 Vascular dementia with behavioral disturbance: Secondary | ICD-10-CM | POA: Diagnosis not present

## 2019-04-30 DIAGNOSIS — F0151 Vascular dementia with behavioral disturbance: Secondary | ICD-10-CM | POA: Diagnosis not present

## 2019-04-30 DIAGNOSIS — R1310 Dysphagia, unspecified: Secondary | ICD-10-CM | POA: Diagnosis not present

## 2019-04-30 DIAGNOSIS — I25119 Atherosclerotic heart disease of native coronary artery with unspecified angina pectoris: Secondary | ICD-10-CM | POA: Diagnosis not present

## 2019-04-30 DIAGNOSIS — U071 COVID-19: Secondary | ICD-10-CM | POA: Diagnosis not present

## 2019-04-30 DIAGNOSIS — I679 Cerebrovascular disease, unspecified: Secondary | ICD-10-CM | POA: Diagnosis not present

## 2019-04-30 DIAGNOSIS — I10 Essential (primary) hypertension: Secondary | ICD-10-CM | POA: Diagnosis not present

## 2019-05-03 DIAGNOSIS — I25119 Atherosclerotic heart disease of native coronary artery with unspecified angina pectoris: Secondary | ICD-10-CM | POA: Diagnosis not present

## 2019-05-03 DIAGNOSIS — R1310 Dysphagia, unspecified: Secondary | ICD-10-CM | POA: Diagnosis not present

## 2019-05-03 DIAGNOSIS — F0151 Vascular dementia with behavioral disturbance: Secondary | ICD-10-CM | POA: Diagnosis not present

## 2019-05-03 DIAGNOSIS — I679 Cerebrovascular disease, unspecified: Secondary | ICD-10-CM | POA: Diagnosis not present

## 2019-05-03 DIAGNOSIS — U071 COVID-19: Secondary | ICD-10-CM | POA: Diagnosis not present

## 2019-05-03 DIAGNOSIS — I10 Essential (primary) hypertension: Secondary | ICD-10-CM | POA: Diagnosis not present

## 2019-05-05 DIAGNOSIS — R1310 Dysphagia, unspecified: Secondary | ICD-10-CM | POA: Diagnosis not present

## 2019-05-05 DIAGNOSIS — F0151 Vascular dementia with behavioral disturbance: Secondary | ICD-10-CM | POA: Diagnosis not present

## 2019-05-05 DIAGNOSIS — I25119 Atherosclerotic heart disease of native coronary artery with unspecified angina pectoris: Secondary | ICD-10-CM | POA: Diagnosis not present

## 2019-05-05 DIAGNOSIS — I679 Cerebrovascular disease, unspecified: Secondary | ICD-10-CM | POA: Diagnosis not present

## 2019-05-05 DIAGNOSIS — I10 Essential (primary) hypertension: Secondary | ICD-10-CM | POA: Diagnosis not present

## 2019-05-05 DIAGNOSIS — U071 COVID-19: Secondary | ICD-10-CM | POA: Diagnosis not present

## 2019-05-06 DIAGNOSIS — R1310 Dysphagia, unspecified: Secondary | ICD-10-CM | POA: Diagnosis not present

## 2019-05-06 DIAGNOSIS — F0151 Vascular dementia with behavioral disturbance: Secondary | ICD-10-CM | POA: Diagnosis not present

## 2019-05-06 DIAGNOSIS — U071 COVID-19: Secondary | ICD-10-CM | POA: Diagnosis not present

## 2019-05-06 DIAGNOSIS — I25119 Atherosclerotic heart disease of native coronary artery with unspecified angina pectoris: Secondary | ICD-10-CM | POA: Diagnosis not present

## 2019-05-06 DIAGNOSIS — I10 Essential (primary) hypertension: Secondary | ICD-10-CM | POA: Diagnosis not present

## 2019-05-06 DIAGNOSIS — I679 Cerebrovascular disease, unspecified: Secondary | ICD-10-CM | POA: Diagnosis not present

## 2019-05-07 DIAGNOSIS — I679 Cerebrovascular disease, unspecified: Secondary | ICD-10-CM | POA: Diagnosis not present

## 2019-05-07 DIAGNOSIS — I25119 Atherosclerotic heart disease of native coronary artery with unspecified angina pectoris: Secondary | ICD-10-CM | POA: Diagnosis not present

## 2019-05-07 DIAGNOSIS — F0151 Vascular dementia with behavioral disturbance: Secondary | ICD-10-CM | POA: Diagnosis not present

## 2019-05-07 DIAGNOSIS — R1310 Dysphagia, unspecified: Secondary | ICD-10-CM | POA: Diagnosis not present

## 2019-05-07 DIAGNOSIS — I10 Essential (primary) hypertension: Secondary | ICD-10-CM | POA: Diagnosis not present

## 2019-05-07 DIAGNOSIS — U071 COVID-19: Secondary | ICD-10-CM | POA: Diagnosis not present

## 2019-05-10 DIAGNOSIS — U071 COVID-19: Secondary | ICD-10-CM | POA: Diagnosis not present

## 2019-05-10 DIAGNOSIS — G4700 Insomnia, unspecified: Secondary | ICD-10-CM | POA: Diagnosis not present

## 2019-05-10 DIAGNOSIS — R451 Restlessness and agitation: Secondary | ICD-10-CM | POA: Diagnosis not present

## 2019-05-10 DIAGNOSIS — F0151 Vascular dementia with behavioral disturbance: Secondary | ICD-10-CM | POA: Diagnosis not present

## 2019-05-10 DIAGNOSIS — I679 Cerebrovascular disease, unspecified: Secondary | ICD-10-CM | POA: Diagnosis not present

## 2019-05-10 DIAGNOSIS — I10 Essential (primary) hypertension: Secondary | ICD-10-CM | POA: Diagnosis not present

## 2019-05-10 DIAGNOSIS — G309 Alzheimer's disease, unspecified: Secondary | ICD-10-CM | POA: Diagnosis not present

## 2019-05-10 DIAGNOSIS — R1310 Dysphagia, unspecified: Secondary | ICD-10-CM | POA: Diagnosis not present

## 2019-05-10 DIAGNOSIS — R443 Hallucinations, unspecified: Secondary | ICD-10-CM | POA: Diagnosis not present

## 2019-05-10 DIAGNOSIS — I25119 Atherosclerotic heart disease of native coronary artery with unspecified angina pectoris: Secondary | ICD-10-CM | POA: Diagnosis not present

## 2019-05-12 DIAGNOSIS — I10 Essential (primary) hypertension: Secondary | ICD-10-CM | POA: Diagnosis not present

## 2019-05-12 DIAGNOSIS — U071 COVID-19: Secondary | ICD-10-CM | POA: Diagnosis not present

## 2019-05-12 DIAGNOSIS — F0151 Vascular dementia with behavioral disturbance: Secondary | ICD-10-CM | POA: Diagnosis not present

## 2019-05-12 DIAGNOSIS — I679 Cerebrovascular disease, unspecified: Secondary | ICD-10-CM | POA: Diagnosis not present

## 2019-05-12 DIAGNOSIS — I25119 Atherosclerotic heart disease of native coronary artery with unspecified angina pectoris: Secondary | ICD-10-CM | POA: Diagnosis not present

## 2019-05-12 DIAGNOSIS — R1310 Dysphagia, unspecified: Secondary | ICD-10-CM | POA: Diagnosis not present

## 2019-05-14 DIAGNOSIS — I25119 Atherosclerotic heart disease of native coronary artery with unspecified angina pectoris: Secondary | ICD-10-CM | POA: Diagnosis not present

## 2019-05-14 DIAGNOSIS — U071 COVID-19: Secondary | ICD-10-CM | POA: Diagnosis not present

## 2019-05-14 DIAGNOSIS — F0151 Vascular dementia with behavioral disturbance: Secondary | ICD-10-CM | POA: Diagnosis not present

## 2019-05-14 DIAGNOSIS — R1310 Dysphagia, unspecified: Secondary | ICD-10-CM | POA: Diagnosis not present

## 2019-05-14 DIAGNOSIS — I679 Cerebrovascular disease, unspecified: Secondary | ICD-10-CM | POA: Diagnosis not present

## 2019-05-14 DIAGNOSIS — I10 Essential (primary) hypertension: Secondary | ICD-10-CM | POA: Diagnosis not present

## 2019-05-16 DIAGNOSIS — F0151 Vascular dementia with behavioral disturbance: Secondary | ICD-10-CM | POA: Diagnosis not present

## 2019-05-16 DIAGNOSIS — I10 Essential (primary) hypertension: Secondary | ICD-10-CM | POA: Diagnosis not present

## 2019-05-16 DIAGNOSIS — I25119 Atherosclerotic heart disease of native coronary artery with unspecified angina pectoris: Secondary | ICD-10-CM | POA: Diagnosis not present

## 2019-05-16 DIAGNOSIS — U071 COVID-19: Secondary | ICD-10-CM | POA: Diagnosis not present

## 2019-05-16 DIAGNOSIS — I679 Cerebrovascular disease, unspecified: Secondary | ICD-10-CM | POA: Diagnosis not present

## 2019-05-16 DIAGNOSIS — R1310 Dysphagia, unspecified: Secondary | ICD-10-CM | POA: Diagnosis not present

## 2019-05-17 DIAGNOSIS — I10 Essential (primary) hypertension: Secondary | ICD-10-CM | POA: Diagnosis not present

## 2019-05-17 DIAGNOSIS — R1310 Dysphagia, unspecified: Secondary | ICD-10-CM | POA: Diagnosis not present

## 2019-05-17 DIAGNOSIS — I25119 Atherosclerotic heart disease of native coronary artery with unspecified angina pectoris: Secondary | ICD-10-CM | POA: Diagnosis not present

## 2019-05-17 DIAGNOSIS — I679 Cerebrovascular disease, unspecified: Secondary | ICD-10-CM | POA: Diagnosis not present

## 2019-05-17 DIAGNOSIS — F0151 Vascular dementia with behavioral disturbance: Secondary | ICD-10-CM | POA: Diagnosis not present

## 2019-05-17 DIAGNOSIS — U071 COVID-19: Secondary | ICD-10-CM | POA: Diagnosis not present

## 2019-05-19 DIAGNOSIS — I679 Cerebrovascular disease, unspecified: Secondary | ICD-10-CM | POA: Diagnosis not present

## 2019-05-19 DIAGNOSIS — U071 COVID-19: Secondary | ICD-10-CM | POA: Diagnosis not present

## 2019-05-19 DIAGNOSIS — F0151 Vascular dementia with behavioral disturbance: Secondary | ICD-10-CM | POA: Diagnosis not present

## 2019-05-19 DIAGNOSIS — I10 Essential (primary) hypertension: Secondary | ICD-10-CM | POA: Diagnosis not present

## 2019-05-19 DIAGNOSIS — I25119 Atherosclerotic heart disease of native coronary artery with unspecified angina pectoris: Secondary | ICD-10-CM | POA: Diagnosis not present

## 2019-05-19 DIAGNOSIS — R1310 Dysphagia, unspecified: Secondary | ICD-10-CM | POA: Diagnosis not present

## 2019-05-20 DIAGNOSIS — I25119 Atherosclerotic heart disease of native coronary artery with unspecified angina pectoris: Secondary | ICD-10-CM | POA: Diagnosis not present

## 2019-05-20 DIAGNOSIS — F0151 Vascular dementia with behavioral disturbance: Secondary | ICD-10-CM | POA: Diagnosis not present

## 2019-05-20 DIAGNOSIS — R1310 Dysphagia, unspecified: Secondary | ICD-10-CM | POA: Diagnosis not present

## 2019-05-20 DIAGNOSIS — U071 COVID-19: Secondary | ICD-10-CM | POA: Diagnosis not present

## 2019-05-20 DIAGNOSIS — I10 Essential (primary) hypertension: Secondary | ICD-10-CM | POA: Diagnosis not present

## 2019-05-20 DIAGNOSIS — I679 Cerebrovascular disease, unspecified: Secondary | ICD-10-CM | POA: Diagnosis not present

## 2019-05-21 DIAGNOSIS — I25119 Atherosclerotic heart disease of native coronary artery with unspecified angina pectoris: Secondary | ICD-10-CM | POA: Diagnosis not present

## 2019-05-21 DIAGNOSIS — I679 Cerebrovascular disease, unspecified: Secondary | ICD-10-CM | POA: Diagnosis not present

## 2019-05-21 DIAGNOSIS — F0151 Vascular dementia with behavioral disturbance: Secondary | ICD-10-CM | POA: Diagnosis not present

## 2019-05-21 DIAGNOSIS — U071 COVID-19: Secondary | ICD-10-CM | POA: Diagnosis not present

## 2019-05-21 DIAGNOSIS — I10 Essential (primary) hypertension: Secondary | ICD-10-CM | POA: Diagnosis not present

## 2019-05-21 DIAGNOSIS — R1310 Dysphagia, unspecified: Secondary | ICD-10-CM | POA: Diagnosis not present

## 2019-05-22 DIAGNOSIS — F17220 Nicotine dependence, chewing tobacco, uncomplicated: Secondary | ICD-10-CM | POA: Diagnosis not present

## 2019-05-22 DIAGNOSIS — I679 Cerebrovascular disease, unspecified: Secondary | ICD-10-CM | POA: Diagnosis not present

## 2019-05-22 DIAGNOSIS — F0151 Vascular dementia with behavioral disturbance: Secondary | ICD-10-CM | POA: Diagnosis not present

## 2019-05-22 DIAGNOSIS — J45909 Unspecified asthma, uncomplicated: Secondary | ICD-10-CM | POA: Diagnosis not present

## 2019-05-22 DIAGNOSIS — I252 Old myocardial infarction: Secondary | ICD-10-CM | POA: Diagnosis not present

## 2019-05-22 DIAGNOSIS — U071 COVID-19: Secondary | ICD-10-CM | POA: Diagnosis not present

## 2019-05-22 DIAGNOSIS — K219 Gastro-esophageal reflux disease without esophagitis: Secondary | ICD-10-CM | POA: Diagnosis not present

## 2019-05-22 DIAGNOSIS — H3530 Unspecified macular degeneration: Secondary | ICD-10-CM | POA: Diagnosis not present

## 2019-05-22 DIAGNOSIS — E785 Hyperlipidemia, unspecified: Secondary | ICD-10-CM | POA: Diagnosis not present

## 2019-05-22 DIAGNOSIS — I10 Essential (primary) hypertension: Secondary | ICD-10-CM | POA: Diagnosis not present

## 2019-05-22 DIAGNOSIS — I25119 Atherosclerotic heart disease of native coronary artery with unspecified angina pectoris: Secondary | ICD-10-CM | POA: Diagnosis not present

## 2019-05-22 DIAGNOSIS — J309 Allergic rhinitis, unspecified: Secondary | ICD-10-CM | POA: Diagnosis not present

## 2019-05-22 DIAGNOSIS — Z87440 Personal history of urinary (tract) infections: Secondary | ICD-10-CM | POA: Diagnosis not present

## 2019-05-22 DIAGNOSIS — R1310 Dysphagia, unspecified: Secondary | ICD-10-CM | POA: Diagnosis not present

## 2019-05-24 ENCOUNTER — Other Ambulatory Visit: Payer: Self-pay | Admitting: Cardiovascular Disease

## 2019-05-24 DIAGNOSIS — I25119 Atherosclerotic heart disease of native coronary artery with unspecified angina pectoris: Secondary | ICD-10-CM | POA: Diagnosis not present

## 2019-05-24 DIAGNOSIS — F0151 Vascular dementia with behavioral disturbance: Secondary | ICD-10-CM | POA: Diagnosis not present

## 2019-05-24 DIAGNOSIS — R1310 Dysphagia, unspecified: Secondary | ICD-10-CM | POA: Diagnosis not present

## 2019-05-24 DIAGNOSIS — I10 Essential (primary) hypertension: Secondary | ICD-10-CM | POA: Diagnosis not present

## 2019-05-24 DIAGNOSIS — U071 COVID-19: Secondary | ICD-10-CM | POA: Diagnosis not present

## 2019-05-24 DIAGNOSIS — I679 Cerebrovascular disease, unspecified: Secondary | ICD-10-CM | POA: Diagnosis not present

## 2019-05-26 DIAGNOSIS — R1310 Dysphagia, unspecified: Secondary | ICD-10-CM | POA: Diagnosis not present

## 2019-05-26 DIAGNOSIS — I10 Essential (primary) hypertension: Secondary | ICD-10-CM | POA: Diagnosis not present

## 2019-05-26 DIAGNOSIS — I679 Cerebrovascular disease, unspecified: Secondary | ICD-10-CM | POA: Diagnosis not present

## 2019-05-26 DIAGNOSIS — U071 COVID-19: Secondary | ICD-10-CM | POA: Diagnosis not present

## 2019-05-26 DIAGNOSIS — F0151 Vascular dementia with behavioral disturbance: Secondary | ICD-10-CM | POA: Diagnosis not present

## 2019-05-26 DIAGNOSIS — I25119 Atherosclerotic heart disease of native coronary artery with unspecified angina pectoris: Secondary | ICD-10-CM | POA: Diagnosis not present

## 2019-05-27 DIAGNOSIS — I679 Cerebrovascular disease, unspecified: Secondary | ICD-10-CM | POA: Diagnosis not present

## 2019-05-27 DIAGNOSIS — U071 COVID-19: Secondary | ICD-10-CM | POA: Diagnosis not present

## 2019-05-27 DIAGNOSIS — I10 Essential (primary) hypertension: Secondary | ICD-10-CM | POA: Diagnosis not present

## 2019-05-27 DIAGNOSIS — R1310 Dysphagia, unspecified: Secondary | ICD-10-CM | POA: Diagnosis not present

## 2019-05-27 DIAGNOSIS — F0151 Vascular dementia with behavioral disturbance: Secondary | ICD-10-CM | POA: Diagnosis not present

## 2019-05-27 DIAGNOSIS — I25119 Atherosclerotic heart disease of native coronary artery with unspecified angina pectoris: Secondary | ICD-10-CM | POA: Diagnosis not present

## 2019-05-28 DIAGNOSIS — U071 COVID-19: Secondary | ICD-10-CM | POA: Diagnosis not present

## 2019-05-28 DIAGNOSIS — F0151 Vascular dementia with behavioral disturbance: Secondary | ICD-10-CM | POA: Diagnosis not present

## 2019-05-28 DIAGNOSIS — I25119 Atherosclerotic heart disease of native coronary artery with unspecified angina pectoris: Secondary | ICD-10-CM | POA: Diagnosis not present

## 2019-05-28 DIAGNOSIS — I679 Cerebrovascular disease, unspecified: Secondary | ICD-10-CM | POA: Diagnosis not present

## 2019-05-28 DIAGNOSIS — I10 Essential (primary) hypertension: Secondary | ICD-10-CM | POA: Diagnosis not present

## 2019-05-28 DIAGNOSIS — R1310 Dysphagia, unspecified: Secondary | ICD-10-CM | POA: Diagnosis not present

## 2019-05-31 DIAGNOSIS — R1310 Dysphagia, unspecified: Secondary | ICD-10-CM | POA: Diagnosis not present

## 2019-05-31 DIAGNOSIS — U071 COVID-19: Secondary | ICD-10-CM | POA: Diagnosis not present

## 2019-05-31 DIAGNOSIS — F0151 Vascular dementia with behavioral disturbance: Secondary | ICD-10-CM | POA: Diagnosis not present

## 2019-05-31 DIAGNOSIS — I10 Essential (primary) hypertension: Secondary | ICD-10-CM | POA: Diagnosis not present

## 2019-05-31 DIAGNOSIS — I679 Cerebrovascular disease, unspecified: Secondary | ICD-10-CM | POA: Diagnosis not present

## 2019-05-31 DIAGNOSIS — I25119 Atherosclerotic heart disease of native coronary artery with unspecified angina pectoris: Secondary | ICD-10-CM | POA: Diagnosis not present

## 2019-06-02 DIAGNOSIS — I25119 Atherosclerotic heart disease of native coronary artery with unspecified angina pectoris: Secondary | ICD-10-CM | POA: Diagnosis not present

## 2019-06-02 DIAGNOSIS — R1310 Dysphagia, unspecified: Secondary | ICD-10-CM | POA: Diagnosis not present

## 2019-06-02 DIAGNOSIS — U071 COVID-19: Secondary | ICD-10-CM | POA: Diagnosis not present

## 2019-06-02 DIAGNOSIS — I679 Cerebrovascular disease, unspecified: Secondary | ICD-10-CM | POA: Diagnosis not present

## 2019-06-02 DIAGNOSIS — F0151 Vascular dementia with behavioral disturbance: Secondary | ICD-10-CM | POA: Diagnosis not present

## 2019-06-02 DIAGNOSIS — I10 Essential (primary) hypertension: Secondary | ICD-10-CM | POA: Diagnosis not present

## 2019-06-04 DIAGNOSIS — I10 Essential (primary) hypertension: Secondary | ICD-10-CM | POA: Diagnosis not present

## 2019-06-04 DIAGNOSIS — R1310 Dysphagia, unspecified: Secondary | ICD-10-CM | POA: Diagnosis not present

## 2019-06-04 DIAGNOSIS — I679 Cerebrovascular disease, unspecified: Secondary | ICD-10-CM | POA: Diagnosis not present

## 2019-06-04 DIAGNOSIS — F0151 Vascular dementia with behavioral disturbance: Secondary | ICD-10-CM | POA: Diagnosis not present

## 2019-06-04 DIAGNOSIS — I25119 Atherosclerotic heart disease of native coronary artery with unspecified angina pectoris: Secondary | ICD-10-CM | POA: Diagnosis not present

## 2019-06-04 DIAGNOSIS — U071 COVID-19: Secondary | ICD-10-CM | POA: Diagnosis not present

## 2019-06-07 DIAGNOSIS — I10 Essential (primary) hypertension: Secondary | ICD-10-CM | POA: Diagnosis not present

## 2019-06-07 DIAGNOSIS — F0151 Vascular dementia with behavioral disturbance: Secondary | ICD-10-CM | POA: Diagnosis not present

## 2019-06-07 DIAGNOSIS — R1310 Dysphagia, unspecified: Secondary | ICD-10-CM | POA: Diagnosis not present

## 2019-06-07 DIAGNOSIS — I679 Cerebrovascular disease, unspecified: Secondary | ICD-10-CM | POA: Diagnosis not present

## 2019-06-07 DIAGNOSIS — I25119 Atherosclerotic heart disease of native coronary artery with unspecified angina pectoris: Secondary | ICD-10-CM | POA: Diagnosis not present

## 2019-06-07 DIAGNOSIS — U071 COVID-19: Secondary | ICD-10-CM | POA: Diagnosis not present

## 2019-06-22 DEATH — deceased

## 2019-08-13 IMAGING — CR DG CHEST 2V
2 series · 2 of 2 positions shown · non-contrast
Comparison: Chest radiograph performed 11/21/2015

CLINICAL DATA: Upper central chest pain, radiating to the left side
of the neck and left arm. Nausea and dizziness.

EXAM:
CHEST - 2 VIEW

[chest pa]
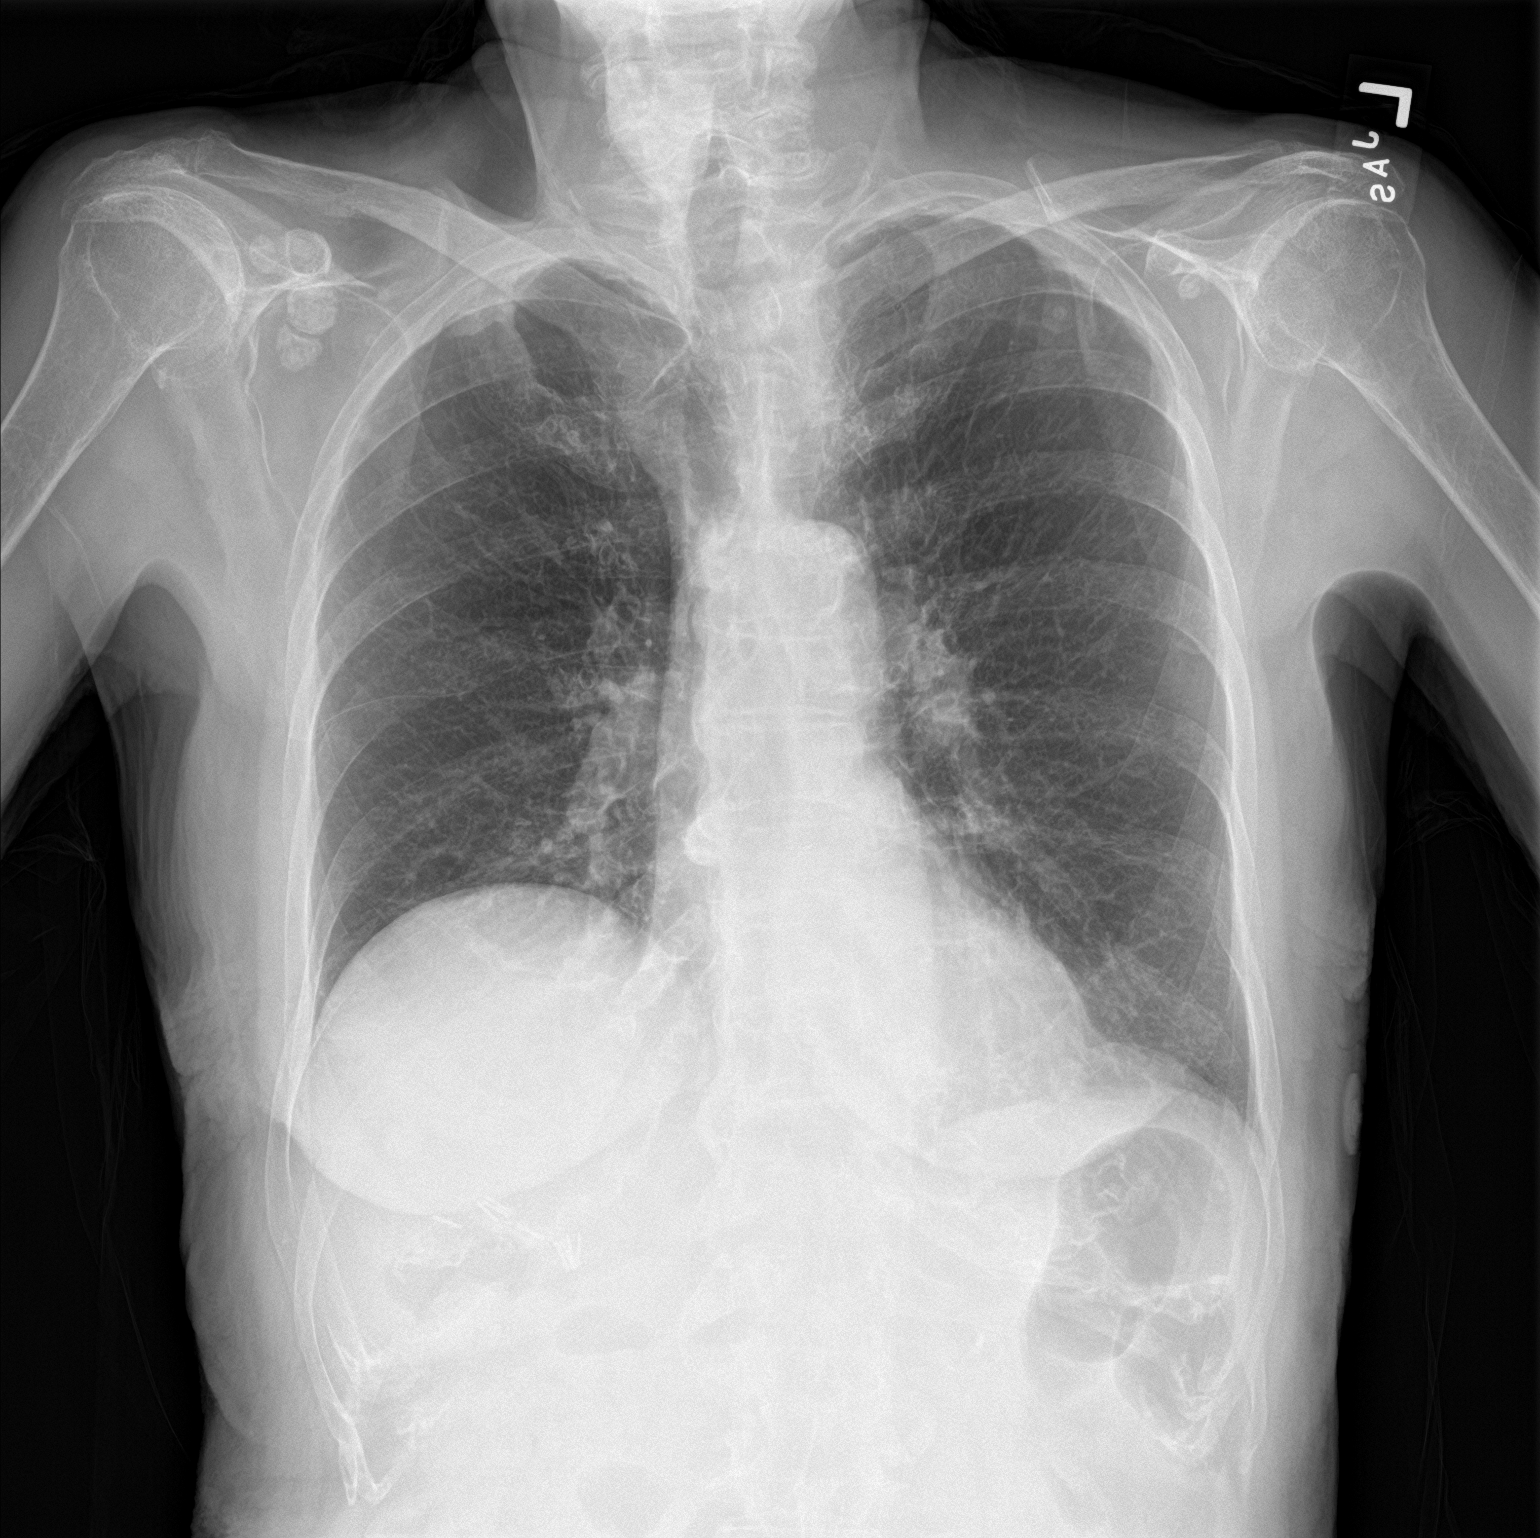

[chest lat]
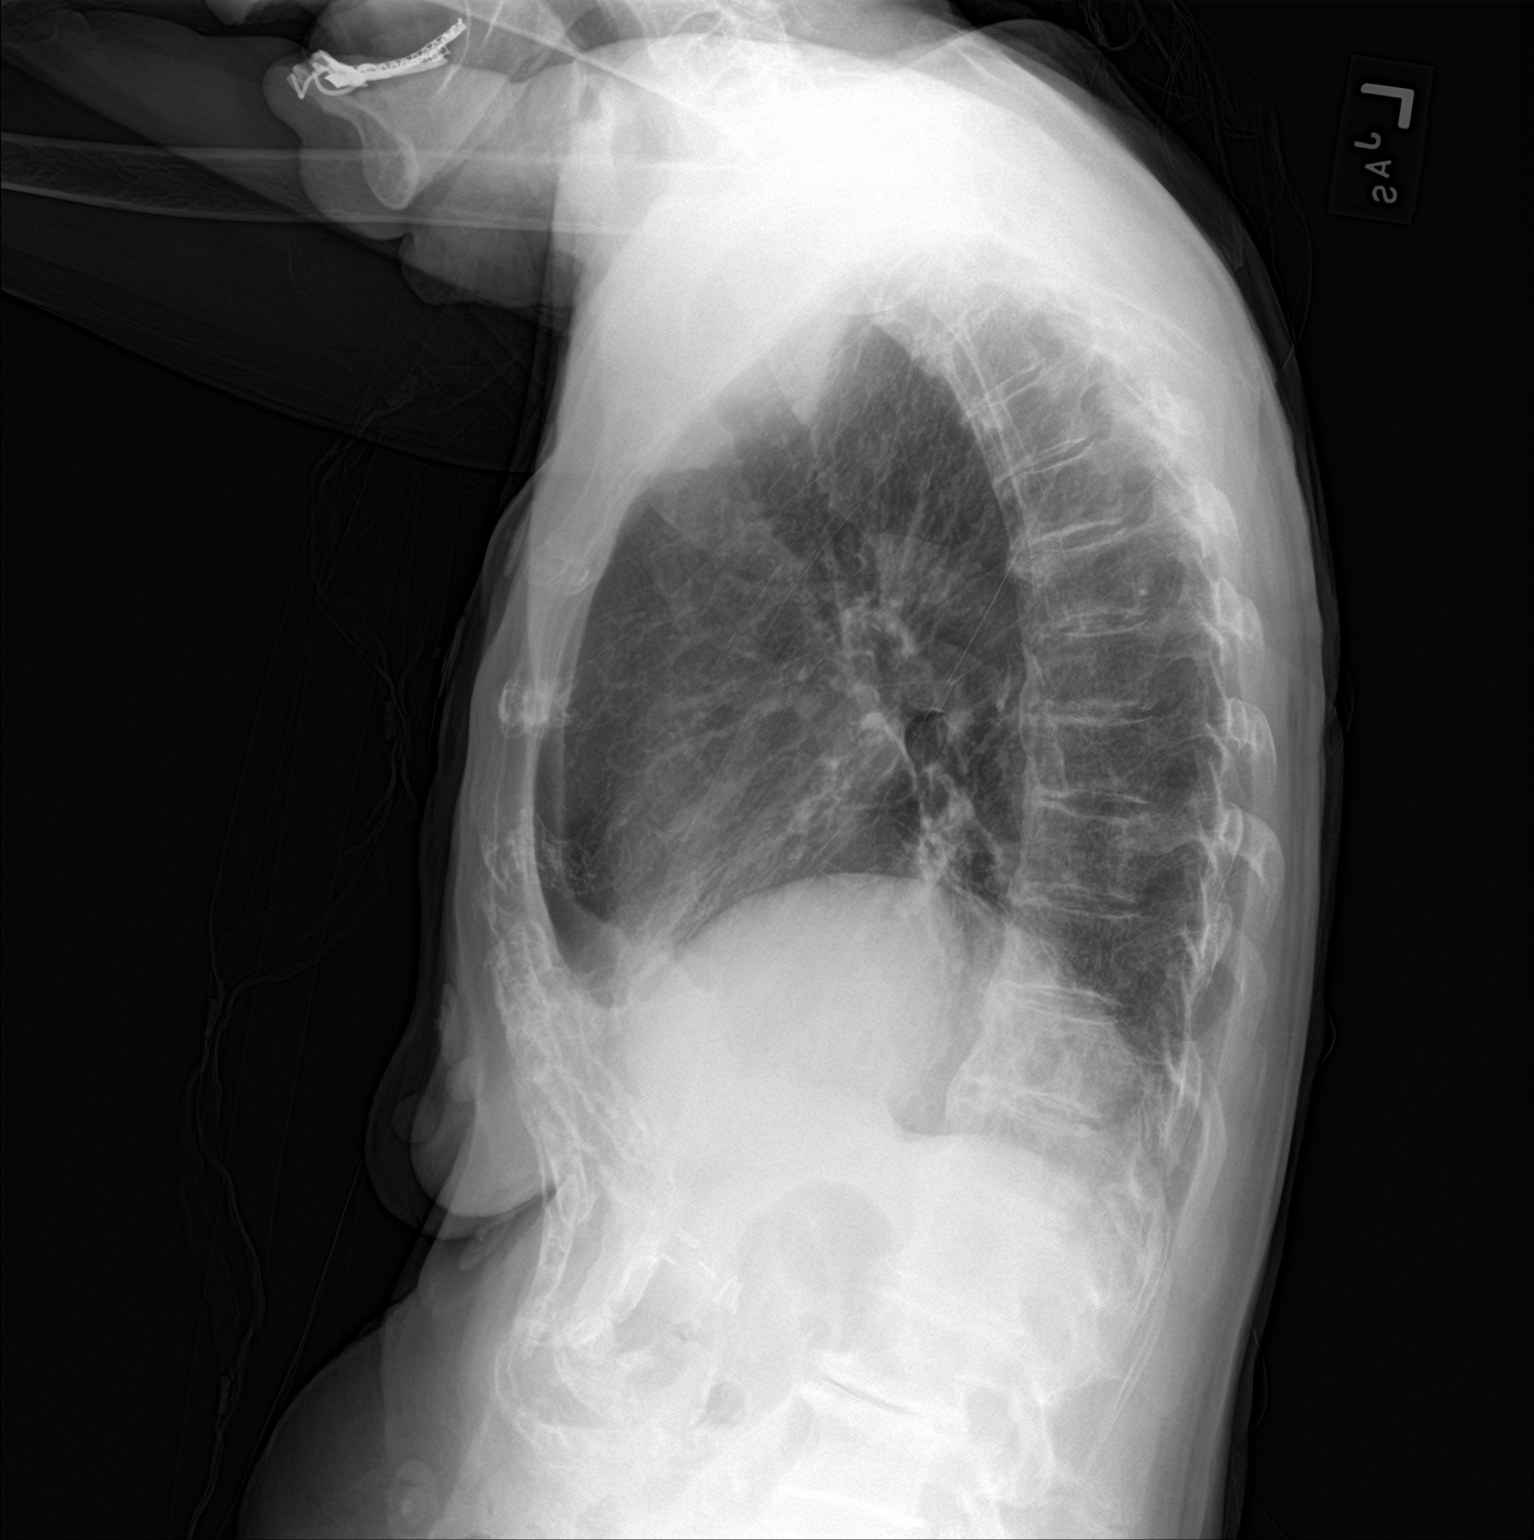

[2 of 2 positions shown; findings below may reference images not displayed]

FINDINGS: The lungs are well-aerated. There is elevation of the right
hemidiaphragm. Peribronchial thickening is noted. There is no
evidence of pleural effusion or pneumothorax.

The heart is normal in size; the mediastinal contour is within
normal limits. No acute osseous abnormalities are seen. Clips are
noted within the right upper quadrant, reflecting prior
cholecystectomy. Loose bodies are noted at the right glenohumeral
joint.
IMPRESSION: Elevation of the right hemidiaphragm.  Peribronchial thickening.

## 2021-02-14 IMAGING — CR DG CHEST 2V
2 series · 2 of 2 positions shown · non-contrast
Comparison: 10/13/2018

CLINICAL DATA: Mechanical fall at home.

EXAM:
CHEST - 2 VIEW

[chest lat]
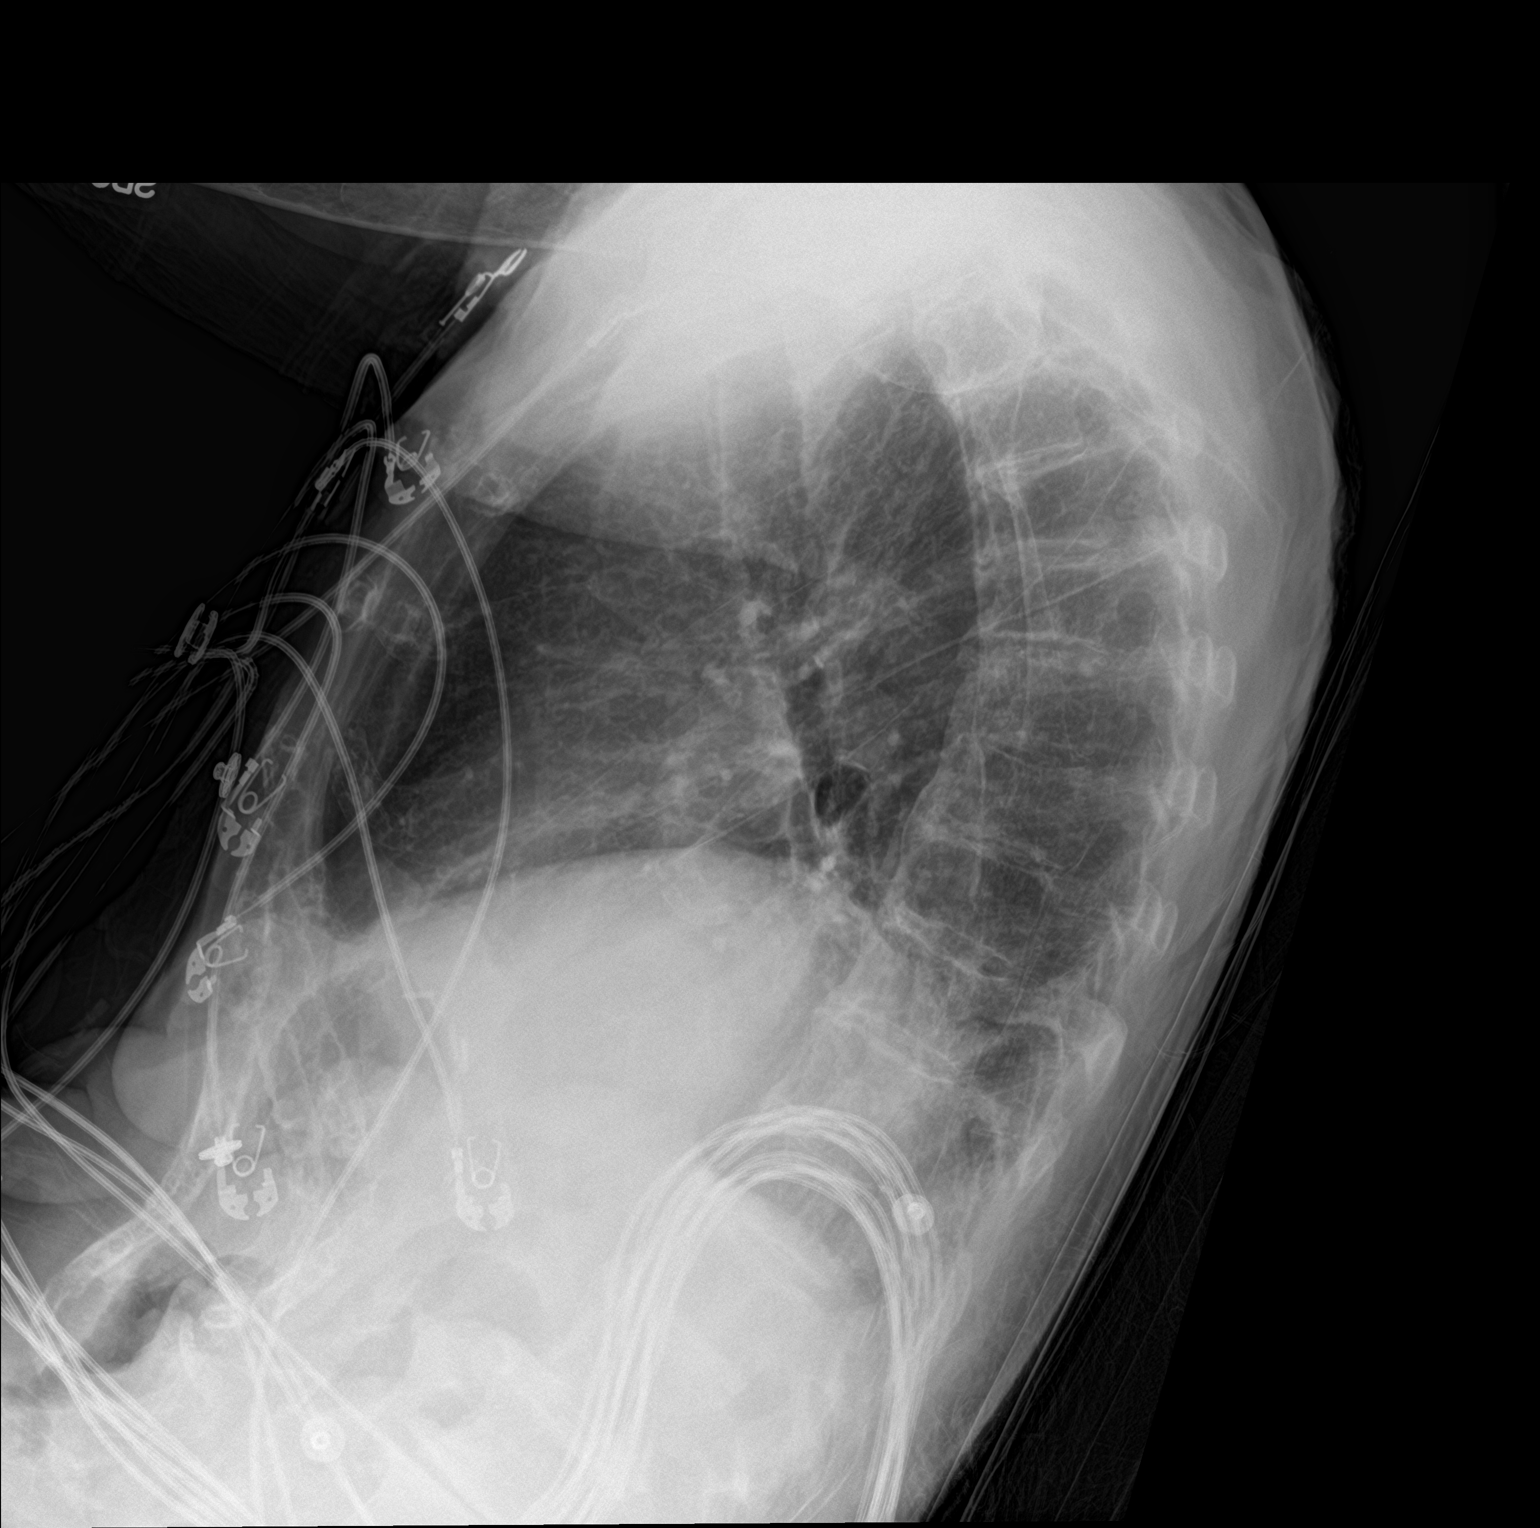

[chest ap]
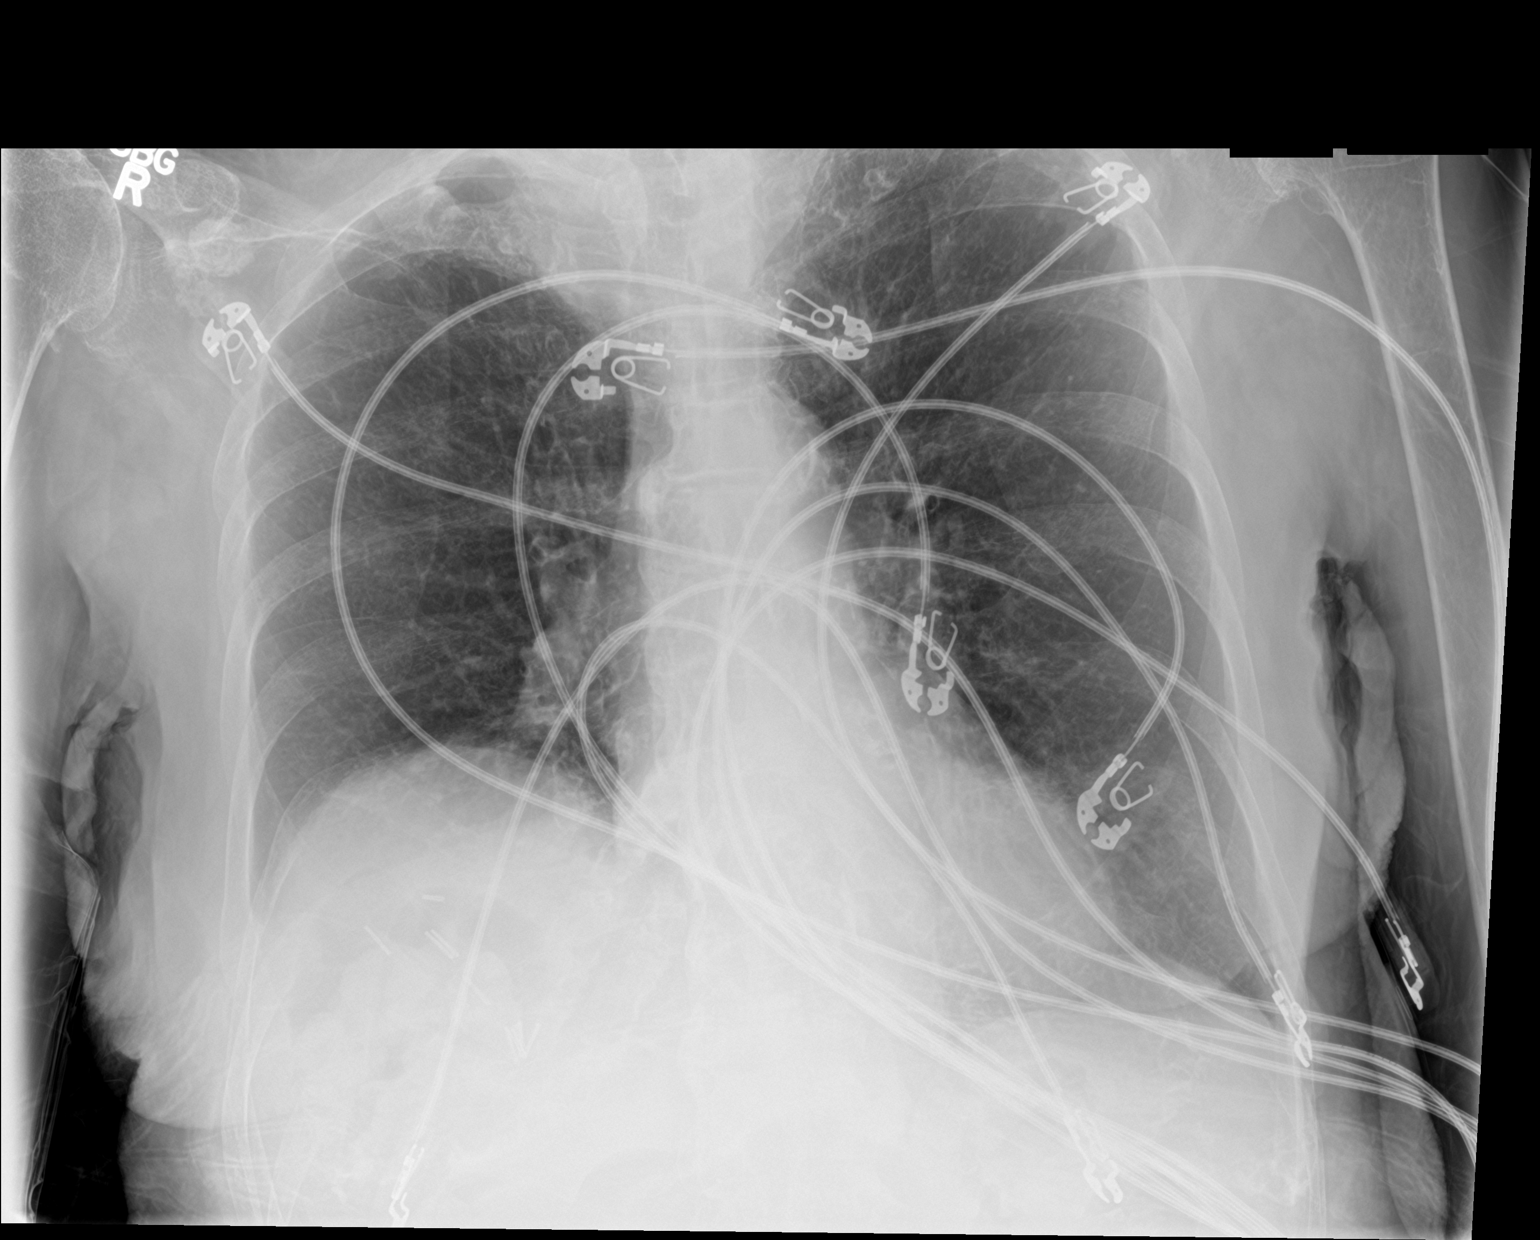

[2 of 2 positions shown; findings below may reference images not displayed]

FINDINGS: Chronic elevation of right hemidiaphragm, unchanged. Unchanged heart
size and mediastinal contours. Atherosclerosis of the aortic arch.
Biapical pleuroparenchymal scarring. No acute airspace disease,
pleural effusion, or pneumothorax. Bones are diffusely under
mineralized. No visualized rib fracture or acute osseous
abnormality.
IMPRESSION: 1. No acute abnormality.
2.  Aortic Atherosclerosis ([id number]).

## 2021-06-16 IMAGING — DX DG CHEST 1V PORT
1 series · 1 of 1 positions shown · non-contrast
Comparison: 11/07/2018

CLINICAL DATA: Short of breath and cough

EXAM:
PORTABLE CHEST 1 VIEW

[chest ap]
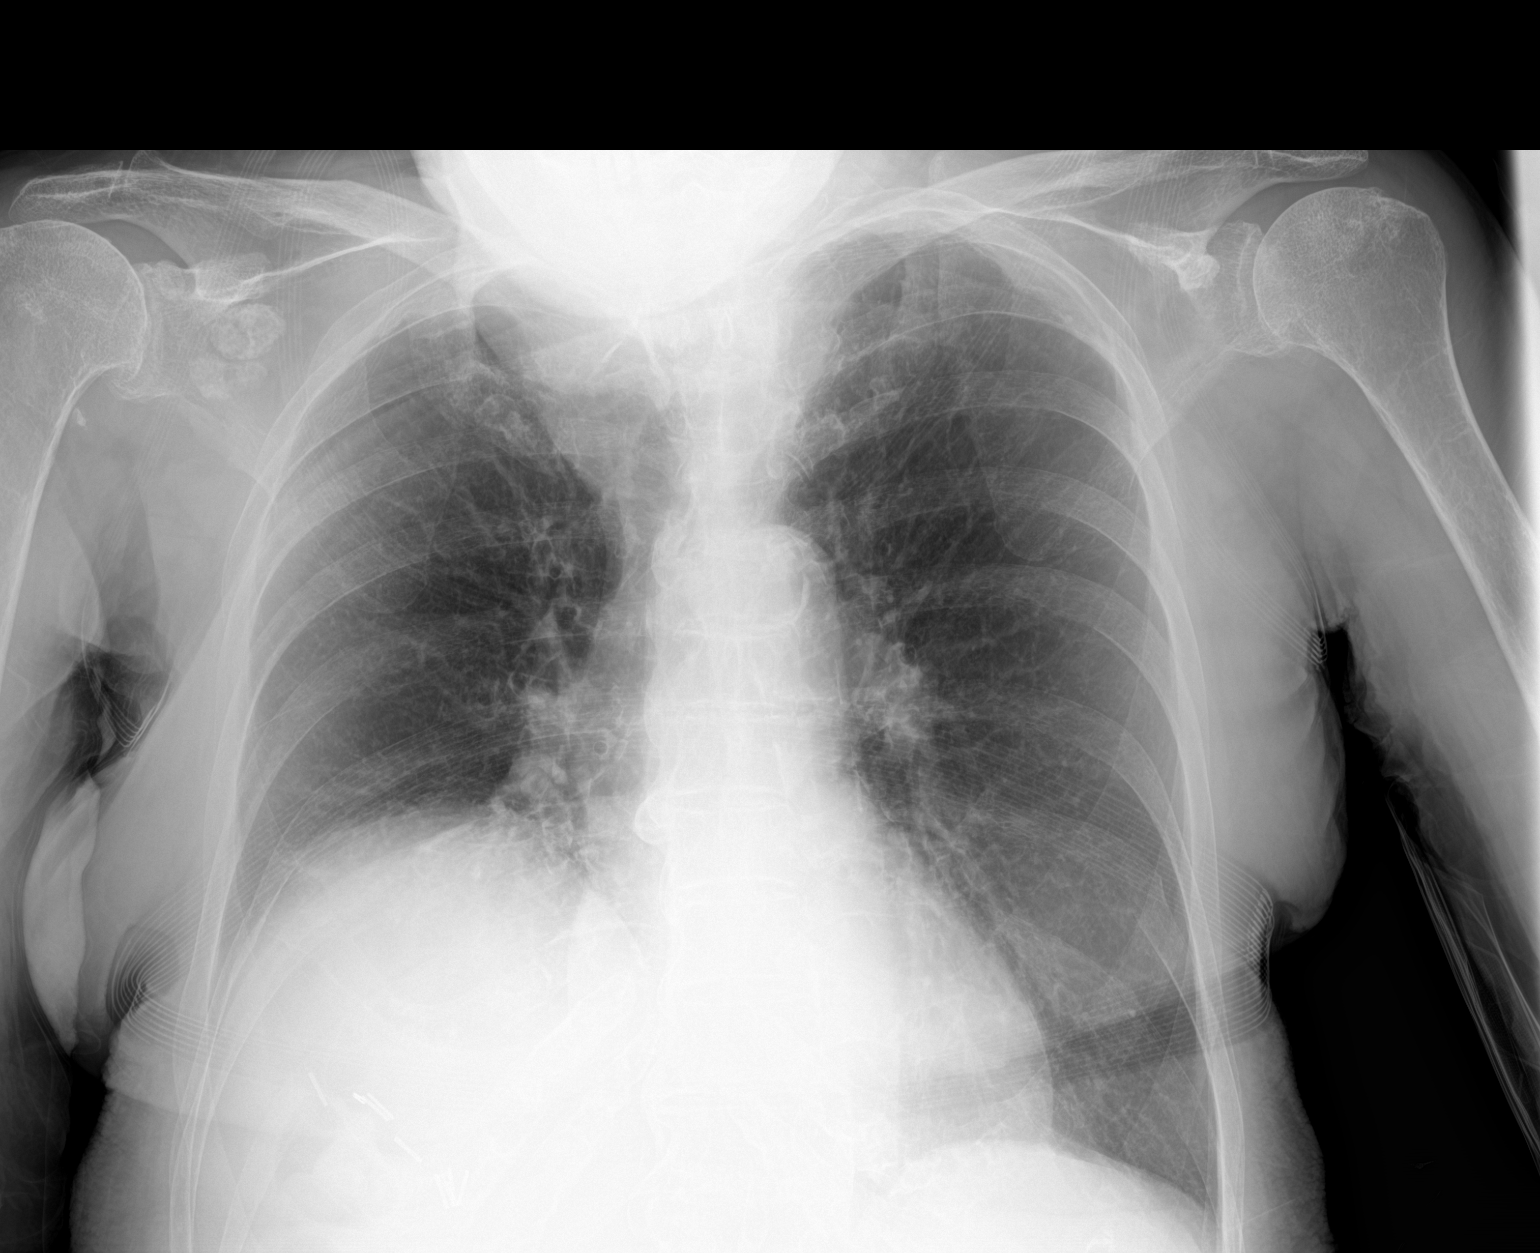

[1 of 1 positions shown; findings below may reference images not displayed]

FINDINGS: Elevated right hemidiaphragm unchanged with right lower lobe
atelectasis.

COPD with pulmonary hyperinflation. No acute infiltrate or effusion.
Negative for heart failure. Atherosclerotic aortic arch.
IMPRESSION: No acute abnormality. Chronic elevation right hemidiaphragm with
right lower lobe atelectasis.
